# Patient Record
Sex: Female | Born: 2000 | Race: Black or African American | Hispanic: No | Smoking: Never smoker
Health system: Southern US, Community
[De-identification: ages and names within clinical notes are randomized; demographics above are authoritative.]

## PROBLEM LIST (undated history)

## (undated) DIAGNOSIS — N946 Dysmenorrhea, unspecified: Secondary | ICD-10-CM

## (undated) DIAGNOSIS — N939 Abnormal uterine and vaginal bleeding, unspecified: Secondary | ICD-10-CM

## (undated) DIAGNOSIS — K219 Gastro-esophageal reflux disease without esophagitis: Secondary | ICD-10-CM

## (undated) HISTORY — DX: Gastro-esophageal reflux disease without esophagitis: K21.9

## (undated) HISTORY — DX: Abnormal uterine and vaginal bleeding, unspecified: N93.9

## (undated) HISTORY — DX: Dysmenorrhea, unspecified: N94.6

---

## 2018-07-07 ENCOUNTER — Other Ambulatory Visit: Payer: Self-pay

## 2018-07-07 ENCOUNTER — Emergency Department (HOSPITAL_COMMUNITY)
Admission: EM | Admit: 2018-07-07 | Discharge: 2018-07-07 | Disposition: A | Discharge status: Home / Self Care | Payer: Self-pay | Attending: Emergency Medicine | Admitting: Emergency Medicine

## 2018-07-07 ENCOUNTER — Encounter (HOSPITAL_COMMUNITY): Payer: Self-pay

## 2018-07-07 DIAGNOSIS — L258 Unspecified contact dermatitis due to other agents: Secondary | ICD-10-CM | POA: Insufficient documentation

## 2018-07-07 MED ORDER — TRIAMCINOLONE ACETONIDE 0.025 % EX CREA: 1.0000 "application " | TOPICAL_CREAM | Freq: Two times a day (BID) | CUTANEOUS | 0 refills | Status: DC

## 2018-07-07 NOTE — ED Provider Notes (Signed)
  [facility] DEPT Provider Note   CSN: [id number] Arrival date & time: 07/07/18  1944     History   Chief Complaint Chief Complaint  Patient presents with  . Rash    HPI Paige Morales is a 18 y.o. female.  The history is provided by the patient.  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  Pt reports her had peel after she works.  Pt reports it happens at work.  Pt reports she wears gloves and job changed her gloves but no improvement History reviewed. No pertinent past medical history.  There are no active problems to display for this patient.      OB History   No obstetric history on file.      Home Medications    Prior to Admission medications   Not on File    Family History History reviewed. No pertinent family history.  Social History Social History   Tobacco Use  . Smoking status: Not on file  Substance Use Topics  . Alcohol use: Not on file  . Drug use: Not on file     Allergies   Patient has no known allergies.   Review of Systems Review of Systems  Skin: Positive for rash.  All other systems reviewed and are negative.    Physical Exam Updated Vital Signs BP 115/86 (BP Location: Left Arm)   Pulse 72   Temp 98 F (36.7 C) (Oral)   Resp 16   SpO2 100%   Physical Exam Vitals signs reviewed.  Constitutional:      Appearance: Normal appearance.  Musculoskeletal: Normal range of motion.  Skin:    General: Skin is warm.  Neurological:     General: No focal deficit present.     Mental Status: She is alert.  Psychiatric:        Mood and Affect: Mood normal.      ED Treatments / Results  Labs (all labs ordered are listed, but only abnormal results are displayed) Labs Reviewed - No data to display  EKG None  Radiology No results  found.  Procedures Procedures (including critical care time)  Medications Ordered in ED Medications - No data to display   Initial Impression / Assessment and Plan / ED Course  I have reviewed the triage vital signs and the nursing notes.  Pertinent labs & imaging results that were available during my care of the patient were reviewed by me and considered in my medical decision making (see chart for details).     MDM  Pt currently does not have symptoms.  normal exam. I suspect dermatitis.  I will try pt on triamcinalone    Final Clinical Impressions(s) / ED Diagnoses   Final diagnoses:  Contact dermatitis due to other agent, unspecified contact dermatitis type    ED Discharge Orders    None    An After Visit Summary was printed and given to the patient.    Sidney Ace 07/07/18 2043    Dorie Rank, MD 07/07/18 [phone]

## 2018-07-07 NOTE — ED Triage Notes (Signed)
Pt reports that her hands intermittently burn, itch, and peel. She reports that it often happens at work. No symptoms right now.

## 2019-02-07 ENCOUNTER — Encounter: Payer: Medicaid Other | Admitting: Obstetrics and Gynecology

## 2019-02-14 ENCOUNTER — Telehealth: Payer: Self-pay | Admitting: Obstetrics and Gynecology

## 2019-02-14 NOTE — Telephone Encounter (Signed)
Called patient regarding appointment and the following message was left:   We have you scheduled for an upcoming appointment at our office. At this time, patients are encouraged to come alone to their visits whenever possible, however, a support person, over age 20, may accompany you to your appointment if assistance is needed for safety or care concerns. Otherwise, support persons should remain outside until the visit is complete.   We ask if you have had any exposure to anyone suspected or confirmed of having COVID-19 or if you are experiencing any of the following, to call and reschedule your appointment: fever, cough, shortness of breath, muscle pain, diarrhea, rash, vomiting, abdominal pain, red eye, weakness, bruising, bleeding, joint pain, or a severe headache.   Please know we will ask you these questions or similar questions when you arrive for your appointment and again its how we are keeping everyone safe.    Also,to keep you safe, please use the provided hand sanitizer when you enter the office. We are asking everyone in the office to wear a mask to help prevent the spread of germs. If you have a mask of your own, please wear it to your appointment, if not, we are happy to provide one for you.  Thank you for understanding and your cooperation.    [facility] Staff

## 2019-02-15 ENCOUNTER — Encounter: Payer: Self-pay | Admitting: Obstetrics and Gynecology

## 2019-02-15 ENCOUNTER — Other Ambulatory Visit: Payer: Self-pay

## 2019-02-15 ENCOUNTER — Ambulatory Visit (INDEPENDENT_AMBULATORY_CARE_PROVIDER_SITE_OTHER): Payer: Medicaid Other | Admitting: Obstetrics and Gynecology

## 2019-02-15 VITALS — BP 111/64 | HR 75 | Ht 64.0 in | Wt 122.0 lb

## 2019-02-15 DIAGNOSIS — Z113 Encounter for screening for infections with a predominantly sexual mode of transmission: Secondary | ICD-10-CM

## 2019-02-15 DIAGNOSIS — N898 Other specified noninflammatory disorders of vagina: Secondary | ICD-10-CM

## 2019-02-15 DIAGNOSIS — Z30011 Encounter for initial prescription of contraceptive pills: Secondary | ICD-10-CM | POA: Diagnosis not present

## 2019-02-15 DIAGNOSIS — Z3202 Encounter for pregnancy test, result negative: Secondary | ICD-10-CM | POA: Diagnosis not present

## 2019-02-15 LAB — POCT URINE PREGNANCY: Preg Test, Ur: NEGATIVE

## 2019-02-15 MED ORDER — FLUCONAZOLE 150 MG PO TABS: 150.0000 mg | ORAL_TABLET | ORAL | 1 refills | Status: DC

## 2019-02-15 MED ORDER — NORETHIN ACE-ETH ESTRAD-FE 1-20 MG-MCG(24) PO TABS: 1.0000 | ORAL_TABLET | Freq: Every day | ORAL | 11 refills | Status: DC

## 2019-02-15 MED ORDER — MICONAZOLE NITRATE 2 % VA CREA: 1.0000 | TOPICAL_CREAM | Freq: Every day | VAGINAL | Status: DC

## 2019-02-15 NOTE — Addendum Note (Signed)
Addended by: Diona Fanti A on: 02/15/2019 11:55 AM   Modules accepted: Orders

## 2019-02-15 NOTE — Progress Notes (Signed)
Patient ID: Paige Morales, female   DOB: Dec 29, 2000, 18 y.o.   MRN: [id number]    [facility] Visit  @DATE @            Patient name: Paige Morales MRN [id number]  Date of birth: 2000/12/04  CC & HPI:  [name] is a 18 y.o. female presenting today to discuss contraception management. She has never used any birth control in the past. She is sexually active and always uses condoms during intercourse. Recently, she noticed a new odorless discharge after intercourse which she describes as "creamy."  The pt is not interested in Depo because she is worried about weight gain. Other than that, she is keeping an open mind.  The patient's periods are slightly irregular. She notes that around the time of her periods, she experiences some swelling in her breasts. After her period, she has vaginal itching. The patient denies fever, chills or any other symptoms or complaints at this time.   ROS:  ROS  + abnormal discharge + vaginal itchiness - fever - chills All systems are negative except as noted in the HPI and PMH.   Pertinent History Reviewed:   Reviewed: Medical        No past medical history on file.                            Surgical Hx:    Medications: Reviewed & Updated - see associated section                       Current Outpatient Medications:  .  ibuprofen (ADVIL) 800 MG tablet, Take 800 mg by mouth every 8 (eight) hours as needed., Disp: , Rfl:  .  triamcinolone (KENALOG) 0.025 % cream, Apply 1 application topically 2 (two) times daily. (Patient not taking: Reported on 02/15/2019), Disp: 30 g, Rfl: 0   Social History: Reviewed -  reports that she has never smoked. She has never used smokeless tobacco.  Objective Findings:  Vitals: Blood pressure 111/64, pulse 75, height 5\' 4"  (1.626 m), weight 122 lb (55.3 kg), last menstrual period 02/05/2019.  PHYSICAL EXAMINATION General appearance - alert, well appearing, and in no distress, oriented to person, place, and time and  normal appearing weight Mental status - alert, oriented to person, place, and time, normal mood, behavior, speech, dress, motor activity, and thought processes, affect appropriate to mood  PELVIC Vagina: clumpy whitish yellow discharge at vaginal entrance Wet prep and koh + yeast Discussion: 1. Discussed with pt risks and benefits of various methods of contraception At end of discussion, pt had opportunity to ask questions and has no further questions at this time.  Specific discussion of contraceptive as noted above. Greater than 50% was spent in counseling and coordination of care with the patient.  Total time greater than: 20 minutes  Assessment & Plan:   A:  1. Contraception Management -- pt would like to begin taking BCPs 2. Monilia vaginitis 3. GC/CHL swab done today  P:  1. Rx Loloestrin 1/20 2. Monistat and diflucan  By signing my name below, I, De Burrs, attest that this documentation has been prepared under the direction and in the presence of Jonnie Kind, MD Electronically Signed: De Burrs, Medical Scribe. 02/15/19. 10:48 AM.  I personally performed the services described in this documentation, which was SCRIBED in my presence. The recorded information has been reviewed and considered accurate. It has  been edited as necessary during review. Jonnie Kind, MD

## 2019-02-20 LAB — GC/CHLAMYDIA PROBE AMP
Chlamydia trachomatis, NAA: NEGATIVE
Neisseria Gonorrhoeae by PCR: NEGATIVE

## 2019-03-07 ENCOUNTER — Telehealth: Payer: Self-pay | Admitting: *Deleted

## 2019-03-07 NOTE — Telephone Encounter (Signed)
Patient states Monistat nor Diflucan was not sent to pharmacy.  Informed patient Monistat is OTC and Diflucan was sent.  Patient states pharmacy listed is not correct.  Wanted it sent to [facility]. Pharmacy notified.

## 2019-03-07 NOTE — Telephone Encounter (Signed)
States cream was going to be called to pharmacy.

## 2019-03-12 ENCOUNTER — Telehealth: Payer: Self-pay | Admitting: *Deleted

## 2019-03-12 NOTE — Telephone Encounter (Signed)
Pt says that the doctor was supposed to send in birth control pills but they didn't.

## 2019-03-12 NOTE — Telephone Encounter (Signed)
Left message letting pt know birth control was sent to pharmacy on 02/15/19 with refills. [place]

## 2020-02-05 ENCOUNTER — Ambulatory Visit (INDEPENDENT_AMBULATORY_CARE_PROVIDER_SITE_OTHER): Payer: Medicaid Other | Admitting: Adult Health

## 2020-02-05 ENCOUNTER — Encounter: Payer: Self-pay | Admitting: Adult Health

## 2020-02-05 ENCOUNTER — Other Ambulatory Visit (HOSPITAL_COMMUNITY)
Admission: RE | Admit: 2020-02-05 | Discharge: 2020-02-05 | Disposition: A | Discharge status: Home / Self Care | Payer: Medicaid Other | Source: Ambulatory Visit | Attending: Adult Health | Admitting: Adult Health

## 2020-02-05 VITALS — BP 103/68 | HR 73 | Ht 64.0 in | Wt 140.0 lb

## 2020-02-05 DIAGNOSIS — Z01419 Encounter for gynecological examination (general) (routine) without abnormal findings: Secondary | ICD-10-CM | POA: Insufficient documentation

## 2020-02-05 DIAGNOSIS — Z124 Encounter for screening for malignant neoplasm of cervix: Secondary | ICD-10-CM | POA: Diagnosis not present

## 2020-02-05 DIAGNOSIS — Z113 Encounter for screening for infections with a predominantly sexual mode of transmission: Secondary | ICD-10-CM | POA: Diagnosis not present

## 2020-02-05 DIAGNOSIS — N946 Dysmenorrhea, unspecified: Secondary | ICD-10-CM | POA: Diagnosis not present

## 2020-02-05 DIAGNOSIS — L739 Follicular disorder, unspecified: Secondary | ICD-10-CM | POA: Insufficient documentation

## 2020-02-05 MED ORDER — NAPROXEN SODIUM 550 MG PO TABS: ORAL_TABLET | ORAL | 3 refills | Status: DC

## 2020-02-05 NOTE — Progress Notes (Addendum)
Patient ID: Paige Morales, female   DOB: 05/25/2000, 19 y.o.   MRN: [id number] History of Present Illness: Paige Morales is a 19 year old [place], in for a well woman gyn exam and first pap.    Current Medications, Allergies, Past Medical History, Past Surgical History, Family History and Social History were reviewed in [employer] record.     Review of Systems: Patient denies any headaches, hearing loss, fatigue, blurred vision, shortness of breath, chest pain, problems with bowel movements, urination, or intercourse. No joint pain or mood swings. Has pain with periods and ibuprofen does not help Has bumps from shaving, and occasional itch Has hard time reaching orgasm   Physical Exam:BP 103/68 (BP Location: Left Arm, Patient Position: Sitting, Cuff Size: Normal)   Pulse 73   Ht 5\' 4"  (1.626 m)   Wt 140 lb (63.5 kg)   LMP 01/25/2020 (Exact Date)   BMI 24.03 kg/m  General:  Well developed, well nourished, no acute distress Skin:  Warm and dry Neck:  Midline trachea, normal thyroid, good ROM, no lymphadenopathy Lungs; Clear to auscultation bilaterally Breast:  No dominant palpable mass, retraction, or nipple discharge Cardiovascular: Regular rate and rhythm Abdomen:  Soft, non tender, no hepatosplenomegaly Pelvic:  External genitalia is normal in appearance, has mild folliculitis or razor bumps.  The vagina is normal in appearance. Urethra has no lesions or masses. The cervix is smooth, pap with GC/CHL and reflex HPV performed.  Uterus is felt to be normal size, shape, and contour.  No adnexal masses or tenderness noted.Bladder is non tender, no masses felt. Extremities/musculoskeletal:  No swelling or varicosities noted, no clubbing or cyanosis Psych:  No mood changes, alert and cooperative,seems happy AA is 1 Fall risk is low PHQ 9 score is 0  [place] - 02/05/20 1335      Pregnancy Intention Screening   Does the patient want to become  pregnant in the next year? Unsure    Does the patient's partner want to become pregnant in the next year? No    Would the patient like to discuss contraceptive options today? --   unsure     Contraception Wrap Up   Current Method No Method - Other Reason    End Method No Method - Other Reason    Contraception Counseling Provided No          Examination chaperoned by Diona Fanti CMA   Impression and Plan: 1. Routine cervical smear Pap sent  2. Encounter for gynecological examination with Papanicolaou smear of cervix Pap sent Physical in 1 year Pap in 3 if normal Check CBC,CMP,TSH and lipids Take folic acid 094 mcg 1 daily or PNV Try more clitoral stimulation   3. Dysmenorrhea Will rx anaprox ds Meds ordered this encounter  Medications  . naproxen sodium (ANAPROX DS) 550 MG tablet    Sig: Take 1 every 12 hours as needed for period pain    Dispense:  30 tablet    Refill:  3    Order Specific Question:   Supervising Provider    Answer:   Elonda Husky, LUTHER H [[id number]]    4. Folliculitis Change razors often Do not shave so close   5. Screening examination for STD (sexually transmitted disease) Check HIV and RPR and hepatitis C antibody

## 2020-02-06 LAB — COMPREHENSIVE METABOLIC PANEL
ALT: 8 IU/L (ref 0–32)
AST: 20 IU/L (ref 0–40)
Albumin/Globulin Ratio: 1.3 (ref 1.2–2.2)
Albumin: 4.8 g/dL (ref 3.9–5.0)
Alkaline Phosphatase: 74 IU/L (ref 44–121)
BUN/Creatinine Ratio: 7 — ABNORMAL LOW (ref 9–23)
BUN: 5 mg/dL — ABNORMAL LOW (ref 6–20)
Bilirubin Total: 0.5 mg/dL (ref 0.0–1.2)
CO2: 23 mmol/L (ref 20–29)
Calcium: 9.6 mg/dL (ref 8.7–10.2)
Chloride: 100 mmol/L (ref 96–106)
Creatinine, Ser: 0.74 mg/dL (ref 0.57–1.00)
GFR calc Af Amer: 134 mL/min/{1.73_m2} (ref >59)
GFR calc non Af Amer: 116 mL/min/{1.73_m2} (ref >59)
Globulin, Total: 3.6 g/dL (ref 1.5–4.5)
Glucose: 84 mg/dL (ref 65–99)
Potassium: 4.3 mmol/L (ref 3.5–5.2)
Sodium: 138 mmol/L (ref 134–144)
Total Protein: 8.4 g/dL (ref 6.0–8.5)

## 2020-02-06 LAB — CBC
Hematocrit: 37.5 % (ref 34.0–46.6)
Hemoglobin: 11.5 g/dL (ref 11.1–15.9)
MCH: 24.1 pg — ABNORMAL LOW (ref 26.6–33.0)
MCHC: 30.7 g/dL — ABNORMAL LOW (ref 31.5–35.7)
MCV: 79 fL (ref 79–97)
Platelets: 243 10*3/uL (ref 150–450)
RBC: 4.78 x10E6/uL (ref 3.77–5.28)
RDW: 13.3 % (ref 11.7–15.4)
WBC: 4.3 10*3/uL (ref 3.4–10.8)

## 2020-02-06 LAB — LIPID PANEL
Chol/HDL Ratio: 2.6 ratio (ref 0.0–4.4)
Cholesterol, Total: 150 mg/dL (ref 100–199)
HDL: 57 mg/dL (ref >39)
LDL Chol Calc (NIH): 76 mg/dL (ref 0–99)
Triglycerides: 90 mg/dL (ref 0–149)
VLDL Cholesterol Cal: 17 mg/dL (ref 5–40)

## 2020-02-06 LAB — HIV ANTIBODY (ROUTINE TESTING W REFLEX): HIV Screen 4th Generation wRfx: NONREACTIVE

## 2020-02-06 LAB — HEPATITIS C ANTIBODY: Hep C Virus Ab: 0.2 s/co ratio (ref 0.0–0.9)

## 2020-02-06 LAB — RPR: RPR Ser Ql: NONREACTIVE

## 2020-02-06 LAB — TSH: TSH: 2.5 u[IU]/mL (ref 0.450–4.500)

## 2020-02-07 LAB — CYTOLOGY - PAP
Chlamydia: NEGATIVE
Comment: NEGATIVE
Comment: NORMAL
Diagnosis: NEGATIVE
Neisseria Gonorrhea: NEGATIVE

## 2020-02-20 ENCOUNTER — Telehealth: Payer: Self-pay | Admitting: Adult Health

## 2020-02-20 NOTE — Telephone Encounter (Signed)
Patient called stating that she came into the office to see Anderson Malta on 02/05/2020 and that she was suppose have a medication called into the pharmacy but the pharmacy is stating that they have not received it and that she would need to call us. Please contact please

## 2020-02-20 NOTE — Telephone Encounter (Signed)
Pt was prescribed Anaprox and her insurance is requiring a PA. Pt was advised can get OTC Aleve and take it. Pt voiced understanding. [place]

## 2020-03-19 ENCOUNTER — Telehealth: Payer: Self-pay | Admitting: Adult Health

## 2020-03-19 MED ORDER — FLUCONAZOLE 150 MG PO TABS: ORAL_TABLET | ORAL | 0 refills | Status: DC

## 2020-03-19 NOTE — Telephone Encounter (Signed)
Patient called stating that she was prescribed something for her yeast infection by Dr. Glo Herring when she last saw him and she never picked it up, when she went to the pharmacy they told her that the prescription was expired. Patient would like to know if we could call her in the prescription again to her pharmacy. Pt would also like to inform the provider that her still has Medicaid family Planning and she is not sure if they will cover this medication. Please contact pt

## 2020-03-19 NOTE — Telephone Encounter (Signed)
Will rx diflucan  

## 2020-03-19 NOTE — Addendum Note (Signed)
Addended by: Derrek Monaco A on: 03/19/2020 05:09 PM   Modules accepted: Orders

## 2020-08-06 ENCOUNTER — Other Ambulatory Visit (HOSPITAL_COMMUNITY)
Admission: RE | Admit: 2020-08-06 | Discharge: 2020-08-06 | Disposition: A | Discharge status: Home / Self Care | Payer: Medicaid Other | Source: Ambulatory Visit | Attending: Obstetrics & Gynecology | Admitting: Obstetrics & Gynecology

## 2020-08-06 ENCOUNTER — Other Ambulatory Visit: Payer: Self-pay

## 2020-08-06 ENCOUNTER — Encounter: Payer: Self-pay | Admitting: Women's Health

## 2020-08-06 ENCOUNTER — Ambulatory Visit (INDEPENDENT_AMBULATORY_CARE_PROVIDER_SITE_OTHER): Payer: Self-pay | Admitting: Women's Health

## 2020-08-06 VITALS — BP 118/74 | HR 92 | Ht 64.0 in | Wt 142.0 lb

## 2020-08-06 DIAGNOSIS — N9089 Other specified noninflammatory disorders of vulva and perineum: Secondary | ICD-10-CM | POA: Insufficient documentation

## 2020-08-06 DIAGNOSIS — Z113 Encounter for screening for infections with a predominantly sexual mode of transmission: Secondary | ICD-10-CM | POA: Insufficient documentation

## 2020-08-06 DIAGNOSIS — B373 Candidiasis of vulva and vagina: Secondary | ICD-10-CM | POA: Diagnosis not present

## 2020-08-06 MED ORDER — FLUCONAZOLE 150 MG PO TABS: 150.0000 mg | ORAL_TABLET | Freq: Once | ORAL | 0 refills | Status: AC

## 2020-08-06 NOTE — Progress Notes (Signed)
   GYN VISIT Patient name: Paige Morales MRN [id number]  Date of birth: 2001-03-15 Chief Complaint:   Vaginal Discharge  History of Present Illness:   Paige Morales is a 20 y.o. G0P0000 African American female being seen today for report of vulvar irritation, itching before and after period.  Some occ odor. Looks like there are cuts at times. Has tried vagisil and didn't help.     Depression screen [facility] 2/9 02/05/2020  Decreased Interest 0  Down, Depressed, Hopeless 0  PHQ - 2 Score 0  Altered sleeping 0  Tired, decreased energy 0  Change in appetite 0  Feeling bad or failure about yourself  0  Trouble concentrating 0  Moving slowly or fidgety/restless 0  Suicidal thoughts 0  PHQ-9 Score 0  Difficult doing work/chores Not difficult at all    Patient's last menstrual period was 07/30/2020 (exact date). The current method of family planning is coitus interruptus.  Last pap 02/05/20. Results were: NILM w/ HRHPV not done Review of Systems:   Pertinent items are noted in HPI Denies fever/chills, dizziness, headaches, visual disturbances, fatigue, shortness of breath, chest pain, abdominal pain, vomiting, abnormal vaginal discharge/itching/odor/irritation, problems with periods, bowel movements, urination, or intercourse unless otherwise stated above.  Pertinent History Reviewed:  Reviewed past medical,surgical, social, obstetrical and family history.  Reviewed problem list, medications and allergies. Physical Assessment:   Vitals:   08/06/20 0846  BP: 118/74  Pulse: 92  Weight: 142 lb (64.4 kg)  Height: 5\' 4"  (1.626 m)  Body mass index is 24.37 kg/m.       Physical Examination:   General appearance: alert, well appearing, and in no distress  Mental status: alert, oriented to person, place, and time  Skin: warm & dry   Cardiovascular: normal heart rate noted  Respiratory: normal respiratory effort, no distress  Abdomen: soft, non-tender   Pelvic: VULVA: whitish appearance to areas  of bilateral labia majora c/w canddida, no masses, tenderness or lesions, VAGINA: normal appearing vagina with normal color and some thick clumpy discharge, no lesions, CERVIX: normal appearing cervix without discharge or lesions  Extremities: no edema   Vulva painted w/ gentian violet  Chaperone: Celene Squibb    No results found for this or any previous visit (from the past 24 hour(s)).  Assessment & Plan:  1) Vulvar itching/irritation> appearance c/w candida, painted w/ gentian violet, rx diflucan, send CV swab  2) STD screen  Meds:  Meds ordered this encounter  Medications  . fluconazole (DIFLUCAN) 150 MG tablet    Sig: Take 1 tablet (150 mg total) by mouth once for 1 dose. Take 1 pill now, may take 2nd pill in 3 days if needed    Dispense:  2 tablet    Refill:  0    Order Specific Question:   Supervising Provider    Answer:   Elonda Husky, LUTHER H [[id number]]    No orders of the defined types were placed in this encounter.   No follow-ups on file.  [place], [facility] 08/06/2020 9:23 AM

## 2020-08-07 LAB — CERVICOVAGINAL ANCILLARY ONLY
Bacterial Vaginitis (gardnerella): NEGATIVE
Candida Glabrata: NEGATIVE
Candida Vaginitis: POSITIVE — AB
Chlamydia: NEGATIVE
Comment: NEGATIVE
Comment: NEGATIVE
Comment: NEGATIVE
Comment: NEGATIVE
Comment: NEGATIVE
Comment: NORMAL
Neisseria Gonorrhea: NEGATIVE
Trichomonas: NEGATIVE

## 2021-02-10 ENCOUNTER — Other Ambulatory Visit: Payer: Self-pay | Admitting: Adult Health

## 2021-02-11 ENCOUNTER — Ambulatory Visit: Payer: Medicaid Other | Admitting: Podiatry

## 2021-06-03 DIAGNOSIS — M26622 Arthralgia of left temporomandibular joint: Secondary | ICD-10-CM | POA: Insufficient documentation

## 2021-06-03 DIAGNOSIS — H9202 Otalgia, left ear: Secondary | ICD-10-CM | POA: Insufficient documentation

## 2021-06-24 ENCOUNTER — Telehealth: Payer: Self-pay | Admitting: Adult Health

## 2021-06-24 MED ORDER — FLUCONAZOLE 150 MG PO TABS: ORAL_TABLET | ORAL | 1 refills | Status: DC

## 2021-06-24 NOTE — Telephone Encounter (Signed)
Refilled diflucan

## 2021-06-24 NOTE — Telephone Encounter (Signed)
Patient called stating that she would like for [facility] to call ina refill of her Fluconazole for her yeast infection. Patient states she uses a different pharmacy now [facility] [place]. Address [address] [address]. Please contact pt when medication has been called in.

## 2021-06-24 NOTE — Addendum Note (Signed)
Addended by: Derrek Monaco A on: 06/24/2021 12:47 PM   Modules accepted: Orders

## 2021-06-24 NOTE — Telephone Encounter (Signed)
Pt is requesting Diflucan for a yeast infection. Thanks!! [place]

## 2021-09-04 ENCOUNTER — Emergency Department (HOSPITAL_COMMUNITY)
Admission: EM | Admit: 2021-09-04 | Discharge: 2021-09-04 | Disposition: A | Discharge status: Home / Self Care | Payer: Medicaid Other | Attending: Emergency Medicine | Admitting: Emergency Medicine

## 2021-09-04 ENCOUNTER — Emergency Department (HOSPITAL_COMMUNITY): Payer: Medicaid Other

## 2021-09-04 ENCOUNTER — Other Ambulatory Visit: Payer: Self-pay

## 2021-09-04 ENCOUNTER — Encounter (HOSPITAL_COMMUNITY): Payer: Self-pay

## 2021-09-04 DIAGNOSIS — N9489 Other specified conditions associated with female genital organs and menstrual cycle: Secondary | ICD-10-CM | POA: Insufficient documentation

## 2021-09-04 DIAGNOSIS — R1012 Left upper quadrant pain: Secondary | ICD-10-CM | POA: Insufficient documentation

## 2021-09-04 DIAGNOSIS — R1084 Generalized abdominal pain: Secondary | ICD-10-CM | POA: Insufficient documentation

## 2021-09-04 DIAGNOSIS — R197 Diarrhea, unspecified: Secondary | ICD-10-CM | POA: Insufficient documentation

## 2021-09-04 LAB — CBC
HCT: 35.3 % — ABNORMAL LOW (ref 36.0–46.0)
Hemoglobin: 11 g/dL — ABNORMAL LOW (ref 12.0–15.0)
MCH: 24.7 pg — ABNORMAL LOW (ref 26.0–34.0)
MCHC: 31.2 g/dL (ref 30.0–36.0)
MCV: 79.3 fL — ABNORMAL LOW (ref 80.0–100.0)
Platelets: 223 10*3/uL (ref 150–400)
RBC: 4.45 MIL/uL (ref 3.87–5.11)
RDW: 12.7 % (ref 11.5–15.5)
WBC: 8 10*3/uL (ref 4.0–10.5)
nRBC: 0 % (ref 0.0–0.2)

## 2021-09-04 LAB — COMPREHENSIVE METABOLIC PANEL
ALT: 34 U/L (ref 0–44)
AST: 77 U/L — ABNORMAL HIGH (ref 15–41)
Albumin: 4.1 g/dL (ref 3.5–5.0)
Alkaline Phosphatase: 64 U/L (ref 38–126)
Anion gap: 10 (ref 5–15)
BUN: 10 mg/dL (ref 6–20)
CO2: 19 mmol/L — ABNORMAL LOW (ref 22–32)
Calcium: 9 mg/dL (ref 8.9–10.3)
Chloride: 106 mmol/L (ref 98–111)
Creatinine, Ser: 0.7 mg/dL (ref 0.44–1.00)
GFR, Estimated: >60 mL/min (ref >60)
Glucose, Bld: 109 mg/dL — ABNORMAL HIGH (ref 70–99)
Potassium: 3.4 mmol/L — ABNORMAL LOW (ref 3.5–5.1)
Sodium: 135 mmol/L (ref 135–145)
Total Bilirubin: 1 mg/dL (ref 0.3–1.2)
Total Protein: 7.2 g/dL (ref 6.5–8.1)

## 2021-09-04 LAB — I-STAT BETA HCG BLOOD, ED (MC, WL, AP ONLY): I-stat hCG, quantitative: <5 m[IU]/mL (ref <5)

## 2021-09-04 LAB — LIPASE, BLOOD: Lipase: 45 U/L (ref 11–51)

## 2021-09-04 MED ORDER — LEVOFLOXACIN 500 MG PO TABS: 500.0000 mg | ORAL_TABLET | Freq: Every day | ORAL | 0 refills | Status: AC

## 2021-09-04 NOTE — ED Provider Notes (Signed)
?[facility] ?Provider Note ? ? ?CSN: [id number] ?Arrival date & time: 09/04/21  0013 ? ?  ? ?History ? ?Chief Complaint  ?Patient presents with  ? Abdominal Pain  ? ? ?Paige Morales is a 21 y.o. female. ? ?Pt reports she began having discomfort and diarrhea on 4/1 after eating at a [employer].   ? ?The history is provided by the patient. No language interpreter was used.  ?Abdominal Pain ?Pain location:  LUQ and RUQ ?Pain quality: aching   ?Pain radiates to:  Does not radiate ?Pain severity:  Moderate ?Onset quality:  Gradual ?Duration:  2 weeks ?Timing:  Constant ?Progression:  Worsening ?Chronicity:  New ?Context: not sick contacts   ?Relieved by:  Nothing ?Worsened by:  Nothing ?Ineffective treatments:  None tried ?Associated symptoms: no fever   ?Risk factors: no alcohol abuse   ? ?  ? ?Home Medications ?Prior to Admission medications   ?Medication Sig Start Date End Date Taking? Authorizing Provider  ?Biotin w/ Vitamins C & E (HAIR/SKIN/NAILS PO) Take 2 tablets by mouth daily as needed (When Pt remembers-Growth).   Yes [provider]  ?Brimonidine Tartrate (LUMIFY OP) Place 1 drop into both eyes daily.   Yes [provider]  ?famotidine (PEPCID) 40 MG tablet Take 40 mg by mouth at bedtime as needed. 08/26/21  Yes [provider]  ?MELATONIN PO Take 2 tablets by mouth at bedtime as needed (Sleep).   Yes [provider]  ?Multiple Vitamin (MULTIVITAMIN PO) Take 2 tablets by mouth daily as needed (When Pt remembers).   Yes [provider]  ?naproxen sodium (ANAPROX) 550 MG tablet TAKE 1 TABLET BY MOUTH EVERY 12 HOURS AS NEEDED FOR PERIOD PAIN ?Patient taking differently: Take 550 mg by mouth daily as needed (period pain). 02/10/21  Yes Estill Dooms, NP  ?omeprazole (PRILOSEC) 20 MG capsule Take 20 mg by mouth daily. 08/25/21  Yes [provider]  ?OVER THE COUNTER MEDICATION Take 1 tablet by mouth daily as needed  (When Pt remembers). Apple Cider Vinegar Gummies   Yes [provider]  ?fluconazole (DIFLUCAN) 150 MG tablet Take 1 now and 1 in 3 days ?Patient not taking: Reported on 09/04/2021 06/24/21   Estill Dooms, NP  ?   ? ?Allergies    ?Patient has no known allergies.   ? ?Review of Systems   ?Review of Systems  ?Constitutional:  Negative for fever.  ?Gastrointestinal:  Positive for abdominal pain.  ?All other systems reviewed and are negative. ? ?Physical Exam ?Updated Vital Signs ?BP (!) 95/55   Pulse 64   Temp (!) 97.5 ?F (36.4 ?C) (Oral)   Resp 18   LMP 09/04/2021   SpO2 100%  ?Physical Exam ?Vitals and nursing note reviewed.  ?Constitutional:   ?   Appearance: She is well-developed.  ?HENT:  ?   Head: Normocephalic.  ?Cardiovascular:  ?   Rate and Rhythm: Normal rate and regular rhythm.  ?Pulmonary:  ?   Effort: Pulmonary effort is normal.  ?Abdominal:  ?   General: Bowel sounds are normal. There is no distension.  ?   Tenderness: There is generalized abdominal tenderness.  ?Musculoskeletal:     ?   General: Normal range of motion.  ?   Cervical back: Normal range of motion.  ?Neurological:  ?   Mental Status: She is alert and oriented to person, place, and time.  ? ? ?ED Results / Procedures / Treatments   ?  Labs ?(all labs ordered are listed, but only abnormal results are displayed) ?Labs Reviewed  ?COMPREHENSIVE METABOLIC PANEL - Abnormal; Notable for the following components:  ?    Result Value  ? Potassium 3.4 (*)   ? CO2 19 (*)   ? Glucose, Bld 109 (*)   ? AST 77 (*)   ? All other components within normal limits  ?CBC - Abnormal; Notable for the following components:  ? Hemoglobin 11.0 (*)   ? HCT 35.3 (*)   ? MCV 79.3 (*)   ? MCH 24.7 (*)   ? All other components within normal limits  ?GASTROINTESTINAL PANEL BY PCR, STOOL (REPLACES STOOL CULTURE)  ?LIPASE, BLOOD  ?URINALYSIS, ROUTINE W REFLEX MICROSCOPIC  ?I-STAT BETA HCG BLOOD, ED ([facility], [facility], [facility] ONLY)  ? ? ?EKG ?None ? ?Radiology ?US Abdomen  Limited RUQ (LIVER/GB) ? ?Result Date: 09/04/2021 ?CLINICAL DATA:  Right upper quadrant pain. EXAM: ULTRASOUND ABDOMEN LIMITED RIGHT UPPER QUADRANT COMPARISON:  None. FINDINGS: Gallbladder: No gallstones or wall thickening visualized (2.7 mm. No sonographic Murphy sign noted by sonographer. Common bile duct: Diameter: 3.2 mm Liver: No focal lesion identified. Within normal limits in parenchymal echogenicity. Portal vein is patent on color Doppler imaging with normal direction of blood flow towards the liver. Other: None. IMPRESSION: Normal right upper quadrant ultrasound. Electronically Signed   By: Virgina Norfolk M.D.   On: 09/04/2021 00:54   ? ?Procedures ?Procedures  ? ? ?Medications Ordered in ED ?Medications - No data to display ? ?ED Course/ Medical Decision Making/ A&P ?  ?                        ?Medical Decision Making ?Pt complains of diarrhea and abdominal discomfort for 2 weeks.  ? ?Amount and/or Complexity of Data Reviewed ?Independent Historian: friend ?External Data Reviewed: notes. ?   Details: Urgent care notes reviewed ?Labs: ordered. Decision-making details documented in ED Course. ?   Details: WBC count is normal  Hemoglobin is 11 ?Radiology: ordered and independent interpretation performed. Decision-making details documented in ED Course. ?   Details: Gallbladder ultreasound is normal ? ?Risk ?Parenteral controlled substances. ?Risk Details: Stool culture pending.  Pt given rx for levaquin  ? ? ?MDM:  Ultrasound no gallbladder disease.  I ordered stool culture.   ? ? ? ? ? ? ? ?Final Clinical Impression(s) / ED Diagnoses ?Final diagnoses:  ?Diarrhea, unspecified type  ?Left upper quadrant abdominal pain  ? ? ?Rx / DC Orders ?ED Discharge Orders   ? ?      Ordered  ?  levofloxacin (LEVAQUIN) 500 MG tablet  Daily       ? 09/04/21 1557  ? ?  ?  ? ?  ?An After Visit Summary was printed and given to the patient.  ? ?  ?Fransico Meadow, [place] ?09/04/21 1609 ? ?  ?Lucrezia Starch, MD ?09/04/21  1617 ? ?

## 2021-09-04 NOTE — ED Provider Triage Note (Signed)
Emergency Medicine Provider Triage Evaluation Note ? ?Paige Morales , a 21 y.o. female  was evaluated in triage.  Pt complains of epigastric and RUQ pain.  Reports some nausea/vomiting with this.  Denies fever.  Currently on menstrual cycle.. ? ?Review of Systems  ?Positive: Abdominal pain, N/V ?Negative: fever ? ?Physical Exam  ?BP 116/73 (BP Location: Right Arm)   Pulse 61   Temp 98.4 ?F (36.9 ?C) (Oral)   Resp 18   LMP 09/04/2021   SpO2 100%  ? ?Gen:   Awake, no distress   ?Resp:  Normal effort  ?MSK:   Moves extremities without difficulty  ?Other:  Tender epigastrium and right upper quadrant ? ?Medical Decision Making  ?Medically screening exam initiated at 12:23 AM.  Appropriate orders placed.  Paige Morales was informed that the remainder of the evaluation will be completed by another provider, this initial triage assessment does not replace that evaluation, and the importance of remaining in the ED until their evaluation is complete. ? ?RUQ pain, N/V.  Will obtain labs, [place]. ?  ?Larene Pickett, PA-C ?09/04/21 6010 ? ?

## 2021-09-04 NOTE — ED Triage Notes (Signed)
PER EMS: pt reports central abd pain that radiates to her back associated with nausea and vomiting. Currently on menstrual cycle. ? ?EMS adm 100 mcg Fentanyl and '4mg'$  zofran en route ?20g LAC ? ?BP- 109/60, HR - 84, 99% RA, 16-RR, CBG-150 ?

## 2021-09-04 NOTE — Discharge Instructions (Signed)
TRy taking immodium to help with diarrhea  ?

## 2021-12-17 ENCOUNTER — Other Ambulatory Visit (HOSPITAL_COMMUNITY): Payer: Self-pay

## 2021-12-17 MED FILL — Naproxen Sodium Tab 550 MG: ORAL | 15 days supply | Qty: 30 | Fill #0 | Status: AC

## 2021-12-25 ENCOUNTER — Other Ambulatory Visit (HOSPITAL_COMMUNITY): Payer: Self-pay

## 2021-12-25 MED ORDER — DOXYCYCLINE HYCLATE 100 MG PO TABS
100.0000 mg | ORAL_TABLET | Freq: Every day | ORAL | 0 refills | Status: DC
  Filled 2021-12-25: qty 30, 30d supply, fill #0

## 2021-12-28 ENCOUNTER — Other Ambulatory Visit (HOSPITAL_COMMUNITY): Payer: Self-pay

## 2022-01-06 ENCOUNTER — Encounter: Payer: Self-pay | Admitting: Obstetrics and Gynecology

## 2022-01-06 ENCOUNTER — Ambulatory Visit (INDEPENDENT_AMBULATORY_CARE_PROVIDER_SITE_OTHER): Payer: No Typology Code available for payment source | Admitting: Obstetrics and Gynecology

## 2022-01-06 ENCOUNTER — Other Ambulatory Visit (HOSPITAL_COMMUNITY)
Admission: RE | Admit: 2022-01-06 | Discharge: 2022-01-06 | Disposition: A | Discharge status: Home / Self Care | Payer: Medicaid Other | Source: Ambulatory Visit | Attending: Obstetrics and Gynecology | Admitting: Obstetrics and Gynecology

## 2022-01-06 VITALS — BP 100/66 | HR 79 | Ht 64.0 in

## 2022-01-06 DIAGNOSIS — N946 Dysmenorrhea, unspecified: Secondary | ICD-10-CM

## 2022-01-06 DIAGNOSIS — Z113 Encounter for screening for infections with a predominantly sexual mode of transmission: Secondary | ICD-10-CM | POA: Diagnosis present

## 2022-01-06 DIAGNOSIS — N920 Excessive and frequent menstruation with regular cycle: Secondary | ICD-10-CM | POA: Diagnosis not present

## 2022-01-06 DIAGNOSIS — L739 Follicular disorder, unspecified: Secondary | ICD-10-CM | POA: Diagnosis not present

## 2022-01-06 LAB — PREGNANCY, URINE: Preg Test, Ur: NEGATIVE

## 2022-01-06 NOTE — Progress Notes (Signed)
GYNECOLOGY  VISIT   HPI: 21 y.o.   Single  african  female   [id number] with Patient's last menstrual period was 12/28/2021 (exact date).   here for heavy/painful menses, symptoms for the last 3 months.   Heavy bleeding is more recent, but the cramps are occurring for a while.  Menses last 3 days.  No bleeding in between cycles. Pad change is every three hours, but is using heavy pads.  Stains through overnight pads when sleeping.  Has pain more left sided.  Does have pain outside of her menses.   Has used Naproxen sodium, and this is not working anymore to control her pain.   Normal pelvic ultrasound 10/24/20 through [facility].   She has a lump that occurs on her labia after her menses every month.  She has white discharge from this.  She also has a lump appear near her rectum after her period as well.  These appear spontaneously, and then disappear.  Has not had testing.  Waxes her vulva.  Stopped shaving.   Female partner, steady relationship for almost 3 years.  Not preventing pregnancy.  Would accept pregnancy, but not trying for fertility right now.   She is looking for information and education about her health concerns.   Works for [employer]. From [place].   UPT today:  negative.  GYNECOLOGIC HISTORY: Patient's last menstrual period was 12/28/2021 (exact date). Contraception:  none Menopausal hormone therapy:  n/a Last mammogram:  n/a Last pap smear: 02-05-20 Neg        OB History     Gravida  0   Para  0   Term  0   Preterm  0   AB  0   Living  0      SAB  0   IAB  0   Ectopic  0   Multiple  0   Live Births  0              Patient Active Problem List   Diagnosis Date Noted   Folliculitis 95/62/1308   Dysmenorrhea 02/05/2020    Past Medical History:  Diagnosis Date   Abnormal uterine bleeding    Dysmenorrhea     History reviewed. No pertinent surgical history.  Current Outpatient Medications  Medication Sig Dispense  Refill   Brimonidine Tartrate (LUMIFY OP) Place 1 drop into both eyes daily.     Multiple Vitamin (MULTIVITAMIN PO) Take 2 tablets by mouth daily as needed (When Pt remembers).     naproxen sodium (ANAPROX) 550 MG tablet TAKE 1 TABLET BY MOUTH EVERY 12 HOURS AS NEEDED FOR PERIOD PAIN (Patient taking differently: Take 550 mg by mouth daily as needed (period pain).) 30 tablet 3   OVER THE COUNTER MEDICATION Take 1 tablet by mouth daily as needed (When Pt remembers). Apple Cider Vinegar Gummies     Propylene Glycol (SYSTANE COMPLETE) 0.6 % SOLN Apply to eye.     No current facility-administered medications for this visit.     ALLERGIES: Patient has no known allergies.  History reviewed. No pertinent family history.  Social History   Socioeconomic History   Marital status: Single    Spouse name: Not on file   Number of children: 0   Years of education: Not on file   Highest education level: Not on file  Occupational History   Not on file  Tobacco Use   Smoking status: Never   Smokeless tobacco: Never  Vaping Use  Vaping Use: Never used  Substance and Sexual Activity   Alcohol use: Yes    Comment: socially--2drinks/mo   Drug use: Never   Sexual activity: Yes    Birth control/protection: None  Other Topics Concern   Not on file  Social History Narrative   Not on file   Social Determinants of Health   Financial Resource Strain: Low Risk  (02/05/2020)   Overall Financial Resource Strain (CARDIA)    Difficulty of Paying Living Expenses: Not hard at all  Food Insecurity: No Food Insecurity (02/05/2020)   Hunger Vital Sign    Worried About Running Out of Food in the Last Year: Never true    Ran Out of Food in the Last Year: Never true  Transportation Needs: No Transportation Needs (02/05/2020)   PRAPARE - Hydrologist (Medical): No    Lack of Transportation (Non-Medical): No  Physical Activity: Inactive (02/05/2020)   Exercise Vital Sign    Days of  Exercise per Week: 0 days    Minutes of Exercise per Session: 0 min  Stress: No Stress Concern Present (02/05/2020)   [place]    Feeling of Stress : Not at all  Social Connections: Socially Isolated (02/05/2020)   Social Connection and Isolation Panel [NHANES]    Frequency of Communication with Friends and Family: More than three times a week    Frequency of Social Gatherings with Friends and Family: Once a week    Attends Religious Services: Never    Marine scientist or Organizations: No    Attends Archivist Meetings: Never    Marital Status: Never married  Intimate Partner Violence: Not At Risk (02/05/2020)   Humiliation, Afraid, Rape, and Kick questionnaire    Fear of Current or Ex-Partner: No    Emotionally Abused: No    Physically Abused: No    Sexually Abused: No    Review of Systems  Genitourinary:  Positive for menstrual problem (painful/heavy cycles).  All other systems reviewed and are negative.   PHYSICAL EXAMINATION:    BP 100/66   Pulse 79   Ht '5\' 4"'$  (1.626 m)   LMP 12/28/2021 (Exact Date)   SpO2 98%   BMI 24.37 kg/m     General appearance: alert, cooperative and appears stated age Head: Normocephalic, without obvious abnormality, atraumatic Neck: no adenopathy, supple, symmetrical, trachea midline and thyroid normal to inspection and palpation Lungs: clear to auscultation bilaterally Heart: regular rate and rhythm Abdomen: soft, non-tender, no masses,  no organomegaly Extremities: extremities normal, atraumatic, no cyanosis or edema Skin: Skin color, texture, turgor normal. No rashes or lesions Lymph nodes: Cervical, supraclavicular, and axillary nodes normal. No abnormal inguinal nodes palpated Neurologic: Grossly normal  Pelvic: External genitalia: prominent hair follicle of the right labia and left mons pubis.  No abscess.               Urethra:  normal appearing urethra  with no masses, tenderness or lesions              Bartholins and Skenes: normal                 Vagina: normal appearing vagina with normal color and discharge, no lesions              Cervix: no lesions                Bimanual Exam:  Uterus:  normal size, contour, position, consistency, mobility, non-tender              Adnexa: right adnexal tenderness and no mass.  No mass, fullness, or tenderness of left adnexa.              Anus:  no lesions noted.         Chaperone was present for exam:  Estill Bamberg, CMA  ASSESSMENT  Menorrhagia with regular menses.  Dysmenorrhea.  STD screening.  Folliculitis.  Anal lump.  Not present today.  Uncertain etiology.   PLAN  We discussed dysmenorrhea and menorrhagia.  List of contraceptive options for patient, the hormonal options of which may prevent pregnancy and also treat menorrhagia and dysmenorrhea. Return for pelvic US and follow up.  STD screening performed today.  She will also return if she has recurrence of vulvar or perianal lesion for further evaluation.     An After Visit Summary was printed and given to the patient.  40 min  total time was spent for this patient encounter, including preparation, face-to-face counseling with the patient, coordination of care, and documentation of the encounter.

## 2022-01-06 NOTE — Patient Instructions (Signed)
Contraception Choices Contraception, also called birth control, refers to methods or devices that prevent pregnancy. Hormonal methods  Contraceptive implant A contraceptive implant is a thin, plastic tube that contains a hormone that prevents pregnancy. It is different from an intrauterine device (IUD). It is inserted into the upper part of the arm by a health care provider. Implants can be effective for up to 3 years. Progestin-only injections Progestin-only injections are injections of progestin, a synthetic form of the hormone progesterone. They are given every 3 months by a health care provider. Birth control pills Birth control pills are pills that contain hormones that prevent pregnancy. They must be taken once a day, preferably at the same time each day. A prescription is needed to use this method of contraception. Birth control patch The birth control patch contains hormones that prevent pregnancy. It is placed on the skin and must be changed once a week for three weeks and removed on the fourth week. A prescription is needed to use this method of contraception. Vaginal ring A vaginal ring contains hormones that prevent pregnancy. It is placed in the vagina for three weeks and removed on the fourth week. After that, the process is repeated with a new ring. A prescription is needed to use this method of contraception. Emergency contraceptive Emergency contraceptives prevent pregnancy after unprotected sex. They come in pill form and can be taken up to 5 days after sex. They work best the sooner they are taken after having sex. Most emergency contraceptives are available without a prescription. This method should not be used as your only form of birth control. Barrier methods  Female condom A female condom is a thin sheath that is worn over the penis during sex. Condoms keep sperm from going inside a woman's body. They can be used with a sperm-killing substance (spermicide) to increase their  effectiveness. They should be thrown away after one use. Female condom A female condom is a soft, loose-fitting sheath that is put into the vagina before sex. The condom keeps sperm from going inside a woman's body. They should be thrown away after one use. Diaphragm A diaphragm is a soft, dome-shaped barrier. It is inserted into the vagina before sex, along with a spermicide. The diaphragm blocks sperm from entering the uterus, and the spermicide kills sperm. A diaphragm should be left in the vagina for 6-8 hours after sex and removed within 24 hours. A diaphragm is prescribed and fitted by a health care provider. A diaphragm should be replaced every 1-2 years, after giving birth, after gaining more than 15 lb (6.8 kg), and after pelvic surgery. Cervical cap A cervical cap is a round, soft latex or plastic cup that fits over the cervix. It is inserted into the vagina before sex, along with spermicide. It blocks sperm from entering the uterus. The cap should be left in place for 6-8 hours after sex and removed within 48 hours. A cervical cap must be prescribed and fitted by a health care provider. It should be replaced every 2 years. Sponge A sponge is a soft, circular piece of polyurethane foam with spermicide in it. The sponge helps block sperm from entering the uterus, and the spermicide kills sperm. To use it, you make it wet and then insert it into the vagina. It should be inserted before sex, left in for at least 6 hours after sex, and removed and thrown away within 30 hours. Spermicides Spermicides are chemicals that kill or block sperm from entering the cervix   and uterus. They can come as a cream, jelly, suppository, foam, or tablet. A spermicide should be inserted into the vagina with an applicator at least 01-60 minutes before sex to allow time for it to work. The process must be repeated every time you have sex. Spermicides do not require a prescription. Intrauterine  contraception Intrauterine device (IUD) An IUD is a T-shaped device that is put in a woman's uterus. There are two types: Hormone IUD.This type contains progestin, a synthetic form of the hormone progesterone. This type can stay in place for 3-5 years. Copper IUD.This type is wrapped in copper wire. It can stay in place for 10 years. Permanent methods of contraception Female tubal ligation In this method, a woman's fallopian tubes are sealed, tied, or blocked during surgery to prevent eggs from traveling to the uterus. Hysteroscopic sterilization In this method, a small, flexible insert is placed into each fallopian tube. The inserts cause scar tissue to form in the fallopian tubes and block them, so sperm cannot reach an egg. The procedure takes about 3 months to be effective. Another form of birth control must be used during those 3 months. Female sterilization This is a procedure to tie off the tubes that carry sperm (vasectomy). After the procedure, the man can still ejaculate fluid (semen). Another form of birth control must be used for 3 months after the procedure. Natural planning methods Natural family planning In this method, a couple does not have sex on days when the woman could become pregnant. Calendar method In this method, the woman keeps track of the length of each menstrual cycle, identifies the days when pregnancy can happen, and does not have sex on those days. Ovulation method In this method, a couple avoids sex during ovulation. Symptothermal method This method involves not having sex during ovulation. The woman typically checks for ovulation by watching changes in her temperature and in the consistency of cervical mucus. Post-ovulation method In this method, a couple waits to have sex until after ovulation. Where to find more information Centers for Disease Control and Prevention: http://www.wolf.info/ Summary Contraception, also called birth control, refers to methods or devices  that prevent pregnancy. Hormonal methods of contraception include implants, injections, pills, patches, vaginal rings, and emergency contraceptives. Barrier methods of contraception can include female condoms, female condoms, diaphragms, cervical caps, sponges, and spermicides. There are two types of IUDs (intrauterine devices). An IUD can be put in a woman's uterus to prevent pregnancy for 3-5 years. Permanent sterilization can be done through a procedure for males and females. Natural family planning methods involve nothaving sex on days when the woman could become pregnant. This information is not intended to replace advice given to you by your health care provider. Make sure you discuss any questions you have with your health care provider. Document Revised: 10/15/2019 Document Reviewed: 10/15/2019 Elsevier Patient Education  [place].   Menorrhagia Menorrhagia is a form of abnormal uterine bleeding in which menstrual periods are heavy or last longer than normal. With menorrhagia, the periods may cause enough blood loss and cramping that a woman becomes unable to take part in her usual activities. What are the causes? Common causes of this condition include: Polyps or fibroids. These are noncancerous growths in the uterus. An imbalance of the hormones estrogen and progesterone. Anovulation, which occurs when one of the ovaries does not release an egg during one or more months. A problem with the thyroid gland (hypothyroidism). Side effects of having an intrauterine device (IUD).  Side effects of some medicines, such as NSAIDs or blood thinners. A bleeding disorder that stops the blood from clotting normally. In some cases, the cause of this condition is not known. What increases the risk? You are more likely to develop this condition if you have cancer of the uterus. What are the signs or symptoms? Symptoms of this condition include: Routinely having to change your pad or tampon  every 1-2 hours because it is soaked. Needing to use pads and tampons at the same time because of heavy bleeding. Needing to wake up to change your pads or tampons during the night. Passing blood clots larger than 1 inch (2.5 cm) in size. Having bleeding that lasts for more than 7 days. Having symptoms of low iron levels (anemia), such as tiredness (fatigue) or shortness of breath. How is this diagnosed? This condition may be diagnosed based on: A physical exam. Your symptoms and menstrual history. Tests, such as: Blood tests to check if you are pregnant or if you have hormonal changes, a bleeding or thyroid disorder, anemia, or other problems. Pap test to check for cancerous changes, infections, or inflammation. Endometrial biopsy. This test involves removing a tissue sample from the lining of the uterus (endometrium) to be examined under a microscope. Pelvic ultrasound. This test uses sound waves to create images of your uterus, ovaries, and vagina. The images can show if you have fibroids or other growths. Hysteroscopy. For this test, a thin, flexible tube with a light on the end (hysteroscope) is used to look inside your uterus. How is this treated? Treatment may not be needed for this condition. If it is needed, the best treatment for you will depend on: Whether you need to prevent pregnancy. Your desire to have children in the future. The cause and severity of your bleeding. Your personal preference. Medicine Medicines are the first step in treatment. You may be treated with: Hormonal birth control methods. These treatments reduce bleeding during your menstrual period. They include: Birth control pills. Skin patch. Vaginal ring. Shots (injections) that you get every 3 months. Hormonal IUD. Implants that go under the skin. Medicines that thicken the blood and slow bleeding. Medicines that reduce swelling, such as ibuprofen. Medicines that contain an artificial (synthetic)  hormone called progestin. Medicines that make the ovaries stop working for a short time. Iron supplements to treat anemia.  Surgery If medicines do not work, surgery may be done. Surgical options may include: Dilation and curettage (D&C). In this procedure, your health care provider opens the lowest part of the uterus (cervix) and then scrapes or suctions tissue from the endometrium. This reduces menstrual bleeding. Operative hysteroscopy. In this procedure, a hysteroscope is used to view your uterus and help remove polyps that may be causing heavy periods. Endometrial ablation. This is when various techniques are used to permanently destroy your entire endometrium. After endometrial ablation, most women have little or no menstrual flow. This procedure reduces your ability to become pregnant. Endometrial resection. In this procedure, an electrosurgical wire loop is used to remove the endometrium. This procedure reduces your ability to become pregnant. Hysterectomy. This is surgical removal of your uterus. This is a permanent procedure that stops menstrual periods. Pregnancy is not possible after a hysterectomy. Follow these instructions at home: Medicines Take over-the-counter and prescription medicines only as told by your health care provider. This includes iron pills. Do not change or switch medicines without asking your health care provider. Do not take aspirin or medicines that contain aspirin  1 week before or during your menstrual period. Aspirin may make bleeding worse. Managing constipation Your iron pills may cause constipation. If you are taking prescription iron supplements, you may need to take these actions to prevent or treat constipation: Drink enough fluid to keep your urine pale yellow. Take over-the-counter or prescription medicines. Eat foods that are high in fiber, such as beans, whole grains, and fresh fruits and vegetables. Limit foods that are high in fat and processed  sugars, such as fried or sweet foods. General instructions If you need to change your sanitary pad or tampon more than once every 2 hours, limit your activity until the bleeding stops. Eat well-balanced meals, including foods that are high in iron. Foods that have a lot of iron include leafy green vegetables, meat, liver, eggs, and whole-grain breads and cereals. Do not try to lose weight until the abnormal bleeding has stopped and your blood iron level is back to normal. If you need to lose weight, work with your health care provider to lose weight safely. Keep all follow-up visits. This is important. Contact a health care provider if: You soak through a pad or tampon every 1 or 2 hours, and this happens every time you have a period. You need to use pads and tampons at the same time because you are bleeding so much. You have nausea, vomiting, diarrhea, or other problems related to medicines you are taking. Get help right away if: You soak through more than a pad or tampon in 1 hour. You pass clots bigger than 1 inch (2.5 cm) wide. You feel short of breath. You feel like your heart is beating too fast. You feel dizzy or you faint. You feel very weak or tired. Summary Menorrhagia is a form of abnormal uterine bleeding in which menstrual periods are heavy or last longer than normal. Treatment may not be needed for this condition. If it is needed, it may include medicines or procedures. Take over-the-counter and prescription medicines only as told by your health care provider. This includes iron pills. Get help right away if you have heavy bleeding that soaks through more than a pad or tampon in 1 hour, you pass large clots, or you feel dizzy, short of breath, or very weak or tired. This information is not intended to replace advice given to you by your health care provider. Make sure you discuss any questions you have with your health care provider. Document Revised: 01/22/2020 Document  Reviewed: 01/22/2020 Elsevier Patient Education  [place].  Dysmenorrhea Dysmenorrhea refers to cramps caused by the muscles of the uterus tightening (contracting) during a menstrual period. Dysmenorrhea may be mild, or it may be severe enough to interfere with everyday activities for a few days each month. Primary dysmenorrhea is menstrual cramps that last a couple of days when a female starts having menstrual periods or soon after. As a female gets older or has a baby, the cramps will usually lessen or disappear. Secondary dysmenorrhea begins later in life and is caused by a disorder in the reproductive system. It lasts longer, and it may cause more pain than primary dysmenorrhea. The pain may start before the period and last a few days after the period. What are the causes? Dysmenorrhea is usually caused by an underlying problem, such as: Endometriosis. The tissue that lines the uterus (endometrium) growing outside of the uterus in other areas of the body. Adenomyosis. Endometrial tissue growing into the muscular walls of the uterus. Pelvic congestive syndrome. Blood  vessels in the pelvis that fill with blood just before the menstrual period. Overgrowth of cells (polyps) in the endometrium or the lower part of the uterus (cervix). Uterine prolapse. The uterus dropping down into the vagina due to stretched or weak muscles. Bladder problems, such as infection or inflammation. Intestinal problems, such as a tumor or irritable bowel syndrome. Cancer of the reproductive organs or bladder. Other causes of this condition may result from: A severely tipped uterus. A cervix that is closed or has a small opening. Noncancerous (benign) tumors in the uterus (fibroids). Pelvic inflammatory disease (PID). Pelvic scarring (adhesions) from a previous surgery. An ovarian cyst. An IUD (intrauterine device). What increases the risk? You are more likely to develop this condition if: You are  younger than 23 years old. You started puberty early. You have irregular or heavy bleeding. You have never given birth. You have a family history of dysmenorrhea. You smoke or use nicotine products. You have high body weight or a low body weight. What are the signs or symptoms? Symptoms of this condition include: Cramping, throbbing pain in lower abdomen or lower back, or a feeling of fullness in the lower abdomen. Periods lasting for longer than 7 days. Headaches. Bloating. Fatigue. Nausea or vomiting. Diarrhea or loose stools. Sweating or dizziness. How is this diagnosed? This condition may be diagnosed based on: Your symptoms. Your medical history. A physical exam. Blood tests. A Pap test. This is a test in which cells from the cervix are tested for signs of cancer or infection. A pregnancy test. You may also have other tests, including: Imaging tests, such as: Ultrasound. A procedure to remove and examine a sample of endometrial tissue (dilation and curettage, D&C). A procedure to visually examine the inside of: The uterus (hysteroscopy). The abdomen or pelvis (laparoscopy). The bladder (cystoscopy). X-rays. CT scan. MRI. How is this treated? Treatment depends on the cause of the dysmenorrhea. Treatment may include medicines, such as: Pain medicines. Hormone replacement therapy. Injections of progesterone to stop the menstrual period. Birth control pills that contain the hormone progesterone. An IUD that contains the hormone progesterone. NSAIDs, such as ibuprofen. These may help to stop the production of hormones that cause cramps. Antidepressant medicines. Other treatment may include: Surgery to remove adhesions, endometriosis, ovarian cysts, fibroids, or the entire uterus (hysterectomy). Endometrial ablation. This is a procedure to destroy the endometrium. Presacral neurectomy. This is a procedure to cut the nerves in the bottom of the spine (sacrum) that go to  the reproductive organs. Sacral nerve stimulation. This is a procedure to apply an electric current to nerves in the sacrum. Exercise and physical therapy. Meditation, yoga, and acupuncture. Work with your health care provider to determine what treatment or combination of treatments is best for you. Follow these instructions at home: Relieving pain and cramping  If directed, apply heat to your lower back or abdomen when you experience pain or cramps. Use the heat source that your health care provider recommends, such as a moist heat pack or a heating pad. Place a towel between your skin and the heat source. Leave the heat on for 20-30 minutes. Remove the heat if your skin turns bright red. This is especially important if you are unable to feel pain, heat, or cold. You may have a greater risk of getting burned. Do not sleep with a heating pad on. Exercise. Activities such as walking, swimming, or biking can help to relieve cramps. Massage your lower back or abdomen to help relieve  pain. General instructions Take over-the-counter and prescription medicines only as told by your health care provider. Ask your health care provider if the medicine prescribed to you requires you to avoid driving or using machinery. Avoid alcohol and caffeine during and right before your period. These can make cramps worse. Do not use any products that contain nicotine or tobacco. These products include cigarettes, chewing tobacco, and vaping devices, such as e-cigarettes. If you need help quitting, ask your health care provider. Keep all follow-up visits. This is important. Contact a health care provider if: You have pain that gets worse or does not get better with medicine. You have pain with sex. You develop nausea or vomiting with your period that is not controlled with medicine. Get help right away if: You faint. Summary Dysmenorrhea refers to cramps caused by the muscles of the uterus tightening  (contracting) during a menstrual period. Dysmenorrhea may be mild, or it may be severe enough to interfere with everyday activities for a few days each month. Treatment depends on the cause of the dysmenorrhea. Work with your health care provider to determine what treatment or combination of treatments is best for you. This information is not intended to replace advice given to you by your health care provider. Make sure you discuss any questions you have with your health care provider. Document Revised: 12/26/2019 Document Reviewed: 12/26/2019 Elsevier Patient Education  [place].

## 2022-01-07 LAB — CERVICOVAGINAL ANCILLARY ONLY
Chlamydia: NEGATIVE
Comment: NEGATIVE
Comment: NEGATIVE
Comment: NORMAL
Neisseria Gonorrhea: NEGATIVE
Trichomonas: NEGATIVE

## 2022-01-18 MED FILL — Naproxen Sodium Tab 550 MG: ORAL | 30 days supply | Qty: 30 | Fill #1 | Status: AC

## 2022-01-19 ENCOUNTER — Other Ambulatory Visit (HOSPITAL_COMMUNITY): Payer: Self-pay

## 2022-02-04 MED FILL — Naproxen Sodium Tab 550 MG: ORAL | 30 days supply | Qty: 30 | Fill #2 | Status: CN

## 2022-02-05 ENCOUNTER — Other Ambulatory Visit (HOSPITAL_COMMUNITY): Payer: Self-pay

## 2022-02-05 MED FILL — Naproxen Sodium Tab 550 MG: ORAL | 15 days supply | Qty: 30 | Fill #2 | Status: CN

## 2022-02-08 NOTE — Progress Notes (Unsigned)
GYNECOLOGY  VISIT   HPI: 21 y.o.   Single  African American  female   [place] with Patient's last menstrual period was 01/20/2022 (exact date).   here for pelvic ultrasound.   Patient has heavy menstrual cycles.  No bleeding in between.   Notes left sided pain and pain when she is not on her cycle.  Naproxen not controlling her pain.   She is not preventing pregnancy and would welcome pregnancy.   GYNECOLOGIC HISTORY: Patient's last menstrual period was 01/20/2022 (exact date). Contraception:  none Menopausal hormone therapy:  n/a Last mammogram:  n/a Last pap smear:  02-05-20 Neg        OB History     Gravida  0   Para  0   Term  0   Preterm  0   AB  0   Living  0      SAB  0   IAB  0   Ectopic  0   Multiple  0   Live Births  0              Patient Active Problem List   Diagnosis Date Noted  . Folliculitis 71/10/2692  . Dysmenorrhea 02/05/2020    Past Medical History:  Diagnosis Date  . Abnormal uterine bleeding   . Dysmenorrhea     History reviewed. No pertinent surgical history.  Current Outpatient Medications  Medication Sig Dispense Refill  . Brimonidine Tartrate (LUMIFY OP) Place 1 drop into both eyes daily.    . Multiple Vitamin (MULTIVITAMIN PO) Take 2 tablets by mouth daily as needed (When Pt remembers).    . naproxen sodium (ANAPROX) 550 MG tablet TAKE 1 TABLET BY MOUTH EVERY 12 HOURS AS NEEDED FOR PERIOD PAIN (Patient taking differently: Take 550 mg by mouth daily as needed (period pain).) 30 tablet 3  . OVER THE COUNTER MEDICATION Take 1 tablet by mouth daily as needed (When Pt remembers). Apple Cider Vinegar Gummies    . Propylene Glycol (SYSTANE COMPLETE) 0.6 % SOLN Apply to eye.     No current facility-administered medications for this visit.     ALLERGIES: Patient has no known allergies.  History reviewed. No pertinent family history.  Social History   Socioeconomic History  . Marital status: Single    Spouse name:  Not on file  . Number of children: 0  . Years of education: Not on file  . Highest education level: Not on file  Occupational History  . Not on file  Tobacco Use  . Smoking status: Never  . Smokeless tobacco: Never  Vaping Use  . Vaping Use: Never used  Substance and Sexual Activity  . Alcohol use: Yes    Comment: socially--2drinks/mo  . Drug use: Never  . Sexual activity: Yes    Birth control/protection: None  Other Topics Concern  . Not on file  Social History Narrative  . Not on file   Social Determinants of Health   Financial Resource Strain: Low Risk  (02/05/2020)   Overall Financial Resource Strain (CARDIA)   . Difficulty of Paying Living Expenses: Not hard at all  Food Insecurity: No Food Insecurity (02/05/2020)   Hunger Vital Sign   . Worried About Charity fundraiser in the Last Year: Never true   . Ran Out of Food in the Last Year: Never true  Transportation Needs: No Transportation Needs (02/05/2020)   PRAPARE - Transportation   . Lack of Transportation (Medical): No   . Lack of  Transportation (Non-Medical): No  Physical Activity: Inactive (02/05/2020)   Exercise Vital Sign   . Days of Exercise per Week: 0 days   . Minutes of Exercise per Session: 0 min  Stress: No Stress Concern Present (02/05/2020)   [place]   . Feeling of Stress : Not at all  Social Connections: Socially Isolated (02/05/2020)   Social Connection and Isolation Panel [NHANES]   . Frequency of Communication with Friends and Family: More than three times a week   . Frequency of Social Gatherings with Friends and Family: Once a week   . Attends Religious Services: Never   . Active Member of Clubs or Organizations: No   . Attends Archivist Meetings: Never   . Marital Status: Never married  Intimate Partner Violence: Not At Risk (02/05/2020)   Humiliation, Afraid, Rape, and Kick questionnaire   . Fear of Current or  Ex-Partner: No   . Emotionally Abused: No   . Physically Abused: No   . Sexually Abused: No    Review of Systems  All other systems reviewed and are negative.   PHYSICAL EXAMINATION:    BP 100/66   Ht '5\' 4"'$  (1.626 m)   Wt 155 lb (70.3 kg)   LMP 01/20/2022 (Exact Date)   BMI 26.61 kg/m     General appearance: alert, cooperative and appears stated age Head: Normocephalic, without obvious abnormality, atraumatic Neck: no adenopathy, supple, symmetrical, trachea midline and thyroid normal to inspection and palpation Lungs: clear to auscultation bilaterally Breasts: normal appearance, no masses or tenderness, No nipple retraction or dimpling, No nipple discharge or bleeding, No axillary or supraclavicular adenopathy Heart: regular rate and rhythm Abdomen: soft, non-tender, no masses,  no organomegaly Extremities: extremities normal, atraumatic, no cyanosis or edema Skin: Skin color, texture, turgor normal. No rashes or lesions Lymph nodes: Cervical, supraclavicular, and axillary nodes normal. No abnormal inguinal nodes palpated Neurologic: Grossly normal  Pelvic: External genitalia:  no lesions              Urethra:  normal appearing urethra with no masses, tenderness or lesions              Bartholins and Skenes: normal                 Vagina: normal appearing vagina with normal color and discharge, no lesions              Cervix: no lesions                Bimanual Exam:  Uterus:  normal size, contour, position, consistency, mobility, non-tender              Adnexa: no mass, fullness, tenderness              Rectal exam: {yes no:[id number]}.  Confirms.              Anus:  normal sphincter tone, no lesions  Chaperone was present for exam:  ***  ASSESSMENT     PLAN  Consider Lysteda.   An After Visit Summary was printed and given to the patient.  ______ minutes face to face time of which over 50% was spent in counseling.

## 2022-02-09 ENCOUNTER — Encounter: Payer: Self-pay | Admitting: Obstetrics and Gynecology

## 2022-02-09 ENCOUNTER — Other Ambulatory Visit (HOSPITAL_COMMUNITY): Payer: Self-pay

## 2022-02-09 ENCOUNTER — Ambulatory Visit (INDEPENDENT_AMBULATORY_CARE_PROVIDER_SITE_OTHER): Payer: No Typology Code available for payment source

## 2022-02-09 ENCOUNTER — Ambulatory Visit (INDEPENDENT_AMBULATORY_CARE_PROVIDER_SITE_OTHER): Payer: No Typology Code available for payment source | Admitting: Obstetrics and Gynecology

## 2022-02-09 VITALS — BP 100/66 | Ht 64.0 in | Wt 155.0 lb

## 2022-02-09 DIAGNOSIS — N920 Excessive and frequent menstruation with regular cycle: Secondary | ICD-10-CM | POA: Diagnosis not present

## 2022-02-09 DIAGNOSIS — D252 Subserosal leiomyoma of uterus: Secondary | ICD-10-CM | POA: Diagnosis not present

## 2022-02-09 DIAGNOSIS — N83201 Unspecified ovarian cyst, right side: Secondary | ICD-10-CM

## 2022-02-09 DIAGNOSIS — N946 Dysmenorrhea, unspecified: Secondary | ICD-10-CM

## 2022-02-09 MED ORDER — IBUPROFEN 800 MG PO TABS
800.0000 mg | ORAL_TABLET | Freq: Three times a day (TID) | ORAL | 2 refills | Status: DC | PRN
Start: 2022-02-09 — End: 2022-06-15
  Filled 2022-02-09: qty 30, 10d supply, fill #0
  Filled 2022-04-16: qty 30, 10d supply, fill #1
  Filled 2022-06-15: qty 30, 10d supply, fill #2

## 2022-02-09 NOTE — Patient Instructions (Addendum)
Diagnostic Laparoscopy Diagnostic laparoscopy is a procedure to diagnose problems in the abdomen. It might be done for a variety of reasons, such as to look for scar tissue, a reason for abdominal pain, an abdominal mass or tumor, or fluid in the abdomen (ascites). This procedure may also be done to remove a tissue sample from the liver to look at under a microscope (biopsy). During the procedure, a thin, flexible tube that has a light and a camera on the end (laparoscope) is inserted through a small incision in the abdomen. The image from the camera is shown on a monitor to help the surgeon see inside the body. Tell a health care provider about: Any allergies you have. All medicines you are taking, including vitamins, herbs, eye drops, creams, and over-the-counter medicines. Any problems you or family members have had with anesthetic medicines. Any blood disorders you have. Any surgeries you have had. Any medical conditions you have. Whether you are pregnant or may be pregnant. What are the risks? Generally, this is a safe procedure. However, problems may occur, including: Infection. Bleeding. Allergic reactions to medicines or dyes. Damage to abdominal structures or organs, such as the intestines, liver, stomach, or spleen. What happens before the procedure? Staying hydrated Follow instructions from your health care provider about hydration, which may include: Up to 2 hours before the procedure - you may continue to drink clear liquids, such as water, clear fruit juice, black coffee, and plain tea.  Eating and drinking restrictions Follow instructions from your health care provider about eating and drinking, which may include: 8 hours before the procedure - stop eating heavy meals or foods, such as meat, fried foods, or fatty foods. 6 hours before the procedure - stop eating light meals or foods, such as toast or cereal. 6 hours before the procedure - stop drinking milk or drinks that  contain milk. 2 hours before the procedure - stop drinking clear liquids. Medicines Ask your health care provider about: Changing or stopping your regular medicines. This is especially important if you are taking diabetes medicines or blood thinners. Taking medicines such as aspirin and ibuprofen. These medicines can thin your blood. Do not take these medicines unless your health care provider tells you to take them. Taking over-the-counter medicines, vitamins, herbs, and supplements. General instructions Ask your health care provider: How your surgery site will be marked. What steps will be taken to help prevent infection. These steps may include: Removing hair at the surgery site. Washing skin with a germ-killing soap. Taking antibiotic medicine. Plan to have a responsible adult take you home from the hospital or clinic. Plan to have a responsible adult care for you for the time you are told after you leave the hospital or clinic. This is important. What happens during the procedure?  An IV will be inserted into one of your veins. You will be given one or more of the following: A medicine to help you relax (sedative). A medicine to numb the area (local anesthetic). A medicine to make you fall asleep (general anesthetic). A breathing tube will be placed down your throat to help you breathe during the procedure. Your abdomen will be filled with an air-like gas so that your abdomen expands. This will give the surgeon more room to operate and will make your organs easier to see. Many small incisions will be made in your abdomen. A laparoscope and other surgical instruments will be inserted into your abdomen through these incisions. A biopsy may be done.   This will depend on the reason why you are having this procedure. The laparoscope and other instruments will be removed from your abdomen. The air-like gas will be released from your abdomen. Your incisions will be closed with stitches  (sutures), skin glue, or surgical tapes and covered with a bandage (dressing). Your breathing tube will be removed. The procedure may vary among health care providers and hospitals. What happens after the procedure? Your blood pressure, heart rate, breathing rate, and blood oxygen level will be monitored until you leave the hospital or clinic. If you were given a sedative during the procedure, it can affect you for several hours. Do not drive or operate machinery until your health care provider says that it is safe. It is up to you to get the results of your procedure. Ask your health care provider, or the department that is doing the procedure, when your results will be ready. Summary Diagnostic laparoscopy is a procedure to diagnose problems in the abdomen using a thin, flexible tube that has a light and a camera on the end (laparoscope). Follow instructions from your health care provider about how to prepare for the procedure. Plan to have a responsible adult care for you for the time you are told after you leave the hospital or clinic. This is important. This information is not intended to replace advice given to you by your health care provider. Make sure you discuss any questions you have with your health care provider. Document Revised: 01/04/2020 Document Reviewed: 01/04/2020 Elsevier Patient Education  [employer].    Tranexamic Acid Tablets What is this medication? TRANEXAMIC ACID (TRAN ex AM ik AS id) treats heavy periods. It works by preventing blood clots from breaking down too quickly. This helps your blood clot normally, which reduces blood loss. This medicine may be used for other purposes; ask your health care provider or pharmacist if you have questions. COMMON BRAND NAME(S): Cyklokapron, Lysteda What should I tell my care team before I take this medication? They need to know if you have any of these conditions: Bleeding in the brain Blood clotting problems Kidney  disease Vision problems An unusual allergic reaction to tranexamic acid, other medications, foods, dyes, or preservatives Pregnant or trying to get pregnant Breast-feeding How should I use this medication? Take this medication by mouth with water. Take it as directed on the prescription label at the same time every day. Do not cut, crush, or chew this medication. Swallow the tablets whole. You can take it with or without food. If it upsets your stomach, take it with food. Do not take this medication until your period has started. Do not take it for more than 5 days in a row. Do not take this medication when you do not have your period. Talk to your care team about the use of this medication in children. While it may be prescribed for children as young as 12 years for selected conditions, precautions do apply. Overdosage: If you think you have taken too much of this medicine contact a poison control center or emergency room at once. NOTE: This medicine is only for you. Do not share this medicine with others. What if I miss a dose? If you miss a dose, take it as soon as you remember. Then, take your next dose at least 6 hours later. Do not take double or extra doses. What may interact with this medication? Do not take this medication with any of the following: Estrogen and progestin hormones This  medication may also interact with the following: Certain medications that prevent or treat blood clots Tretinoin This list may not describe all possible interactions. Give your health care provider a list of all the medicines, herbs, non-prescription drugs, or dietary supplements you use. Also tell them if you smoke, drink alcohol, or use illegal drugs. Some items may interact with your medicine. What should I watch for while using this medication? Visit your care team for regular checks on your progress. Tell your care team if your symptoms do not start to get better or if they get worse. This medication  can cause serious eye damage. Tell your care team right away if you have changes in your eyesight. What side effects may I notice from receiving this medication? Side effects that you should report to your care team as soon as possible: Allergic reactions--skin rash, itching, hives, swelling of the face, lips, tongue, or throat Blood clot--pain, swelling, or warmth in the leg, shortness of breath, chest pain Change in vision such as blurry vision, seeing halos around lights, vision loss Stroke--sudden numbness or weakness of the face, arm, or leg, trouble speaking, confusion, trouble walking, loss of balance or coordination, dizziness, severe headache, change in vision Side effects that usually do not require medical attention (report to your care team if they continue or are bothersome): Bone, joint, or muscle pain Fatigue Headache Runny or stuffy nose Stomach pain This list may not describe all possible side effects. Call your doctor for medical advice about side effects. You may report side effects to FDA at 1-800-FDA-1088. Where should I keep my medication? Keep out of the reach of children and pets. Store at room temperature between 20 and 25 degrees C (68 and 77 degrees F). Get rid of any unused medication after the expiration date. To get rid of medications that are no longer needed or have expired: Take the medication to a medication take-back program. Check with your pharmacy or law enforcement to find a location. If you cannot return the medication, check the label or package insert to see if the medication should be thrown out in the garbage or flushed down the toilet. If you are not sure, ask your care team. If it is safe to put it in the trash, take the medication out of the container. Mix the medication with cat litter, dirt, coffee grounds, or other unwanted substance. Seal the mixture in a bag or container. Put it in the trash. NOTE: This sheet is a summary. It may not cover all  possible information. If you have questions about this medicine, talk to your doctor, pharmacist, or health care provider.  2023 Elsevier/Gold Standard (2008-04-15 00:00:00)

## 2022-02-10 ENCOUNTER — Other Ambulatory Visit (HOSPITAL_COMMUNITY): Payer: Self-pay

## 2022-02-11 ENCOUNTER — Other Ambulatory Visit (HOSPITAL_COMMUNITY): Payer: Self-pay

## 2022-02-11 MED FILL — Naproxen Sodium Tab 550 MG: ORAL | 30 days supply | Qty: 30 | Fill #2 | Status: CN

## 2022-02-12 ENCOUNTER — Other Ambulatory Visit (HOSPITAL_COMMUNITY): Payer: Self-pay

## 2022-02-22 ENCOUNTER — Ambulatory Visit (INDEPENDENT_AMBULATORY_CARE_PROVIDER_SITE_OTHER): Payer: No Typology Code available for payment source | Admitting: Family Medicine

## 2022-02-22 ENCOUNTER — Encounter: Payer: Self-pay | Admitting: Family Medicine

## 2022-02-22 ENCOUNTER — Other Ambulatory Visit (HOSPITAL_COMMUNITY): Payer: Self-pay

## 2022-02-22 ENCOUNTER — Ambulatory Visit: Payer: Medicaid Other | Admitting: Gastroenterology

## 2022-02-22 VITALS — BP 100/66 | HR 70 | Temp 98.0°F | Resp 16 | Ht 64.0 in | Wt 168.0 lb

## 2022-02-22 DIAGNOSIS — Z1329 Encounter for screening for other suspected endocrine disorder: Secondary | ICD-10-CM

## 2022-02-22 DIAGNOSIS — Z0000 Encounter for general adult medical examination without abnormal findings: Secondary | ICD-10-CM

## 2022-02-22 DIAGNOSIS — Z13228 Encounter for screening for other metabolic disorders: Secondary | ICD-10-CM

## 2022-02-22 DIAGNOSIS — Z13220 Encounter for screening for lipoid disorders: Secondary | ICD-10-CM

## 2022-02-22 DIAGNOSIS — Z7689 Persons encountering health services in other specified circumstances: Secondary | ICD-10-CM

## 2022-02-22 DIAGNOSIS — Z130 Encounter for screening for diseases of the blood and blood-forming organs and certain disorders involving the immune mechanism: Secondary | ICD-10-CM | POA: Diagnosis not present

## 2022-02-22 MED ORDER — FLUOROMETHOLONE 0.1 % OP SUSP
OPHTHALMIC | 0 refills | Status: AC
Start: 2022-02-22 — End: ?
  Filled 2022-02-22: qty 10, 28d supply, fill #0

## 2022-02-22 MED ORDER — CYCLOSPORINE 0.05 % OP EMUL
1.0000 [drp] | Freq: Two times a day (BID) | OPHTHALMIC | 3 refills | Status: AC
  Filled 2022-02-22: qty 180, 90d supply, fill #0

## 2022-02-23 ENCOUNTER — Other Ambulatory Visit: Payer: No Typology Code available for payment source

## 2022-02-23 ENCOUNTER — Encounter: Payer: Self-pay | Admitting: *Deleted

## 2022-02-23 ENCOUNTER — Other Ambulatory Visit (HOSPITAL_COMMUNITY): Payer: Self-pay

## 2022-02-23 NOTE — Progress Notes (Signed)
New Patient Office Visit  Subjective    Patient ID: Bonnetta Allbee, female    DOB: November 07, 2000  Age: 21 y.o. MRN: [id number]  CC:  Chief Complaint  Patient presents with   Establish Care    HPI Amsi Grimley presents to establish care and for routine annual exam. Patient denies acute complaints or concerns.    Outpatient Encounter Medications as of 02/22/2022  Medication Sig   Brimonidine Tartrate (LUMIFY OP) Place 1 drop into both eyes daily.   ibuprofen (ADVIL) 800 MG tablet Take 1 tablet (800 mg total) by mouth every 8 (eight) hours as needed.   Multiple Vitamin (MULTIVITAMIN PO) Take 2 tablets by mouth daily as needed (When Pt remembers).   OVER THE COUNTER MEDICATION Take 1 tablet by mouth daily as needed (When Pt remembers). Apple Cider Vinegar Gummies   Propylene Glycol (SYSTANE COMPLETE) 0.6 % SOLN Apply to eye.   No facility-administered encounter medications on file as of 02/22/2022.    Past Medical History:  Diagnosis Date   Abnormal uterine bleeding    Dysmenorrhea    GERD (gastroesophageal reflux disease)     History reviewed. No pertinent surgical history.  History reviewed. No pertinent family history.  Social History   Socioeconomic History   Marital status: Single    Spouse name: Not on file   Number of children: 0   Years of education: Not on file   Highest education level: Not on file  Occupational History   Not on file  Tobacco Use   Smoking status: Never   Smokeless tobacco: Never  Vaping Use   Vaping Use: Never used  Substance and Sexual Activity   Alcohol use: Yes    Alcohol/week: 1.0 standard drink of alcohol    Types: 1 Glasses of wine per week    Comment: socially--2drinks/mo   Drug use: Never   Sexual activity: Yes    Birth control/protection: None  Other Topics Concern   Not on file  Social History Narrative   Not on file   Social Determinants of Health   Financial Resource Strain: Low Risk  (02/05/2020)   Overall Financial  Resource Strain (CARDIA)    Difficulty of Paying Living Expenses: Not hard at all  Food Insecurity: No Food Insecurity (02/05/2020)   Hunger Vital Sign    Worried About Running Out of Food in the Last Year: Never true    Ran Out of Food in the Last Year: Never true  Transportation Needs: No Transportation Needs (02/05/2020)   PRAPARE - Hydrologist (Medical): No    Lack of Transportation (Non-Medical): No  Physical Activity: Inactive (02/05/2020)   Exercise Vital Sign    Days of Exercise per Week: 0 days    Minutes of Exercise per Session: 0 min  Stress: No Stress Concern Present (02/05/2020)   [place]    Feeling of Stress : Not at all  Social Connections: Socially Isolated (02/05/2020)   Social Connection and Isolation Panel [NHANES]    Frequency of Communication with Friends and Family: More than three times a week    Frequency of Social Gatherings with Friends and Family: Once a week    Attends Religious Services: Never    Marine scientist or Organizations: No    Attends Archivist Meetings: Never    Marital Status: Never married  Intimate Partner Violence: Not At Risk (02/05/2020)   Humiliation, Afraid, Rape,  and Kick questionnaire    Fear of Current or Ex-Partner: No    Emotionally Abused: No    Physically Abused: No    Sexually Abused: No    Review of Systems  All other systems reviewed and are negative.       Objective    BP 100/66   Pulse 70   Temp 98 F (36.7 C) (Oral)   Resp 16   Ht '5\' 4"'  (1.626 m)   Wt 168 lb (76.2 kg)   LMP 01/20/2022 (Exact Date)   SpO2 97%   BMI 28.84 kg/m   Physical Exam Vitals and nursing note reviewed.  Constitutional:      General: She is not in acute distress. HENT:     Head: Normocephalic and atraumatic.     Right Ear: Tympanic membrane, ear canal and external ear normal.     Left Ear: Tympanic membrane, ear canal  and external ear normal.     Nose: Nose normal.     Mouth/Throat:     Mouth: Mucous membranes are moist.     Pharynx: Oropharynx is clear.  Eyes:     Conjunctiva/sclera: Conjunctivae normal.     Pupils: Pupils are equal, round, and reactive to light.  Neck:     Thyroid: No thyromegaly.  Cardiovascular:     Rate and Rhythm: Normal rate and regular rhythm.     Heart sounds: Normal heart sounds. No murmur heard. Pulmonary:     Effort: Pulmonary effort is normal. No respiratory distress.     Breath sounds: Normal breath sounds.  Abdominal:     General: There is no distension.     Palpations: Abdomen is soft. There is no mass.     Tenderness: There is no abdominal tenderness.  Musculoskeletal:        General: Normal range of motion.     Cervical back: Normal range of motion and neck supple.  Skin:    General: Skin is warm and dry.  Neurological:     General: No focal deficit present.     Mental Status: She is alert and oriented to person, place, and time.  Psychiatric:        Mood and Affect: Mood normal.        Behavior: Behavior normal.         Assessment & Plan:   1. Annual physical exam  - CMP14+EGFR  2. Screening for deficiency anemia  - CBC with Differential  3. Screening for lipid disorders  - Lipid Panel  4. Screening for endocrine/metabolic/immunity disorders  - TSH - Hemoglobin A1c  5. Encounter to establish care     Return in about 1 year (around 02/23/2023) for physical.   Becky Sax, MD

## 2022-02-24 LAB — CBC WITH DIFFERENTIAL/PLATELET
Basophils Absolute: 0 10*3/uL (ref 0.0–0.2)
Basos: 1 %
EOS (ABSOLUTE): 0.1 10*3/uL (ref 0.0–0.4)
Eos: 1 %
Hematocrit: 37.2 % (ref 34.0–46.6)
Hemoglobin: 12.1 g/dL (ref 11.1–15.9)
Immature Grans (Abs): 0 10*3/uL (ref 0.0–0.1)
Immature Granulocytes: 0 %
Lymphocytes Absolute: 2.8 10*3/uL (ref 0.7–3.1)
Lymphs: 48 %
MCH: 25.3 pg — ABNORMAL LOW (ref 26.6–33.0)
MCHC: 32.5 g/dL (ref 31.5–35.7)
MCV: 78 fL — ABNORMAL LOW (ref 79–97)
Monocytes Absolute: 0.5 10*3/uL (ref 0.1–0.9)
Monocytes: 9 %
Neutrophils Absolute: 2.4 10*3/uL (ref 1.4–7.0)
Neutrophils: 41 %
Platelets: 251 10*3/uL (ref 150–450)
RBC: 4.79 x10E6/uL (ref 3.77–5.28)
RDW: 13 % (ref 11.7–15.4)
WBC: 5.8 10*3/uL (ref 3.4–10.8)

## 2022-02-24 LAB — CMP14+EGFR
ALT: 16 IU/L (ref 0–32)
AST: 20 IU/L (ref 0–40)
Albumin/Globulin Ratio: 1.5 (ref 1.2–2.2)
Albumin: 4.7 g/dL (ref 4.0–5.0)
Alkaline Phosphatase: 74 IU/L (ref 44–121)
BUN/Creatinine Ratio: 11 (ref 9–23)
BUN: 8 mg/dL (ref 6–20)
Bilirubin Total: 0.3 mg/dL (ref 0.0–1.2)
CO2: 21 mmol/L (ref 20–29)
Calcium: 10 mg/dL (ref 8.7–10.2)
Chloride: 102 mmol/L (ref 96–106)
Creatinine, Ser: 0.74 mg/dL (ref 0.57–1.00)
Globulin, Total: 3.2 g/dL (ref 1.5–4.5)
Glucose: 89 mg/dL (ref 70–99)
Potassium: 4 mmol/L (ref 3.5–5.2)
Sodium: 138 mmol/L (ref 134–144)
Total Protein: 7.9 g/dL (ref 6.0–8.5)
eGFR: 117 mL/min/{1.73_m2} (ref >59)

## 2022-02-24 LAB — LIPID PANEL
Chol/HDL Ratio: 3.2 ratio (ref 0.0–4.4)
Cholesterol, Total: 152 mg/dL (ref 100–199)
HDL: 47 mg/dL (ref >39)
LDL Chol Calc (NIH): 79 mg/dL (ref 0–99)
Triglycerides: 147 mg/dL (ref 0–149)
VLDL Cholesterol Cal: 26 mg/dL (ref 5–40)

## 2022-02-24 LAB — HEMOGLOBIN A1C
Est. average glucose Bld gHb Est-mCnc: 114 mg/dL
Hgb A1c MFr Bld: 5.6 % (ref 4.8–5.6)

## 2022-02-24 LAB — TSH: TSH: 2.32 u[IU]/mL (ref 0.450–4.500)

## 2022-02-25 ENCOUNTER — Other Ambulatory Visit: Payer: No Typology Code available for payment source

## 2022-02-25 ENCOUNTER — Ambulatory Visit (INDEPENDENT_AMBULATORY_CARE_PROVIDER_SITE_OTHER): Payer: Self-pay | Admitting: Gastroenterology

## 2022-02-25 ENCOUNTER — Encounter: Payer: Self-pay | Admitting: Gastroenterology

## 2022-02-25 VITALS — BP 110/76 | HR 78 | Ht 64.0 in | Wt 165.0 lb

## 2022-02-25 DIAGNOSIS — R140 Abdominal distension (gaseous): Secondary | ICD-10-CM

## 2022-02-25 DIAGNOSIS — K5900 Constipation, unspecified: Secondary | ICD-10-CM

## 2022-02-25 NOTE — Patient Instructions (Signed)
If you are age 23 or older, your body mass index should be between 23-30. Your Body mass index is 28.32 kg/m. If this is out of the aforementioned range listed, please consider follow up with your Primary Care Provider.  If you are age 31 or younger, your body mass index should be between 19-25. Your Body mass index is 28.32 kg/m. If this is out of the aformentioned range listed, please consider follow up with your Primary Care Provider.   Your provider has requested that you go to the basement level for lab work before leaving today. Press "B" on the elevator. The lab is located at the first door on the left as you exit the elevator.  Please purchase Metamucil over the counter. Take as directed.   The [facility] providers would like to encourage you to use [facility] to communicate with providers for non-urgent requests or questions.  Due to long hold times on the telephone, sending your provider a message by [facility] may be a faster and more efficient way to get a response.  Please allow 48 business hours for a response.  Please remember that this is for non-urgent requests.   It was a pleasure to see you today!  Thank you for trusting me with your gastrointestinal care!    Scott E.Candis Schatz, MD

## 2022-02-25 NOTE — Progress Notes (Signed)
HPI : Paige Morales is a very pleasant 21 year old female with no significant past medical history who is referred to [place] by Dr. Dorna Mai for further management of chronic GERD symptoms.  Today, however, the patient states that her GERD symptoms (heartburn, acid regurgitation) have not been a problem for her for the past few months.  However, she is bothered by other GI symptoms, particularly constipation and bloating.  She states that she has been bothered by frequent bloating for several months now.  The bloating is often worse after eating, but sometimes is also noted on an empty stomach. The constipation is also been a problem for several months.  Where she used to go every day, now she is going about every 3 days.  Her stools are usually small and hard.  Sometimes she has straining and difficulty evacuating stools.  She will have loose stools during her menses, but otherwise diarrhea is not a problem for her. She does have episodes of left lower quadrant pain.  This pain is sharp and sometimes severe, but typically last for a few seconds.  She notices that her GI symptoms are typically more prominent when she eats at [employer].  She cooks most of her meals, which is typical [place] African fare.  She has been in [place] for 8 years now, but states that her GI issues did not start until several months ago.  She denies any changes in her diet or medications that accompanied her change in bowel habits.  Past Medical History:  Diagnosis Date   Abnormal uterine bleeding    Dysmenorrhea    GERD (gastroesophageal reflux disease)      No past surgical history on file. No family history on file. Social History   Tobacco Use   Smoking status: Never   Smokeless tobacco: Never  Vaping Use   Vaping Use: Never used  Substance Use Topics   Alcohol use: Yes    Alcohol/week: 1.0 standard drink of alcohol    Types: 1 Glasses of wine per week    Comment: socially--2drinks/mo   Drug  use: Never   Current Outpatient Medications  Medication Sig Dispense Refill   Brimonidine Tartrate (LUMIFY OP) Place 1 drop into both eyes daily.     cycloSPORINE (RESTASIS) 0.05 % ophthalmic emulsion Instill 1 drop into both eyes twice a day 180 each 3   fluorometholone (FML) 0.1 % ophthalmic suspension Instill 1 drop into both eyes as directed: 1 drop 4 times a day for 2 weeks then 1 drop 2 times a day for 2 weeks 10 mL 0   ibuprofen (ADVIL) 800 MG tablet Take 1 tablet (800 mg total) by mouth every 8 (eight) hours as needed. 30 tablet 2   Multiple Vitamin (MULTIVITAMIN PO) Take 2 tablets by mouth daily as needed (When Pt remembers).     OVER THE COUNTER MEDICATION Take 1 tablet by mouth daily as needed (When Pt remembers). Apple Cider Vinegar Gummies     Propylene Glycol (SYSTANE COMPLETE) 0.6 % SOLN Apply to eye.     No current facility-administered medications for this visit.   No Known Allergies   Review of Systems: All systems reviewed and negative except where noted in HPI.    US PELVIS TRANSVAGINAL NON-OB (TV ONLY)  Result Date: 02/19/2022 Indication:  heavy and painful menstruation. Finding: Pelvic US: Uterus 6.72 x 4.47 x 4.89 cm. 2.15 x 1.8 cm subserosal fibroid. EMS 12.90 mm.  No mass or increased blood flow  noted. Left ovary - 3.56 x 2.14 x 2.33 cm. Right ovary - 4.07 x 3.17 x 2.2 cm.  Resolving corpus luteum cyst 2.9 x 2.0 x 2.6 cm.  Also small avascular complex cyst 0.7 x 0.7 x 0.8 cm. Normal perfusion to both ovaries. No adnexal masses. Small free fluid in cul de sac.  Impression: Right ovarian cysts - one resolving CL cyst and one small complex cyst. Subserosal fibroid.    Physical Exam: Ht '5\' 4"'$  (1.626 m)   Wt 165 lb (74.8 kg)   LMP 01/20/2022 (Exact Date)   BMI 28.32 kg/m  Constitutional: Pleasant,well-developed, African-American female in no acute distress. HEENT: Normocephalic and atraumatic. Conjunctivae are normal. No scleral icterus. Cardiovascular: Normal  rate, regular rhythm.  Pulmonary/chest: Effort normal and breath sounds normal. No wheezing, rales or rhonchi. Abdominal: Soft, nondistended, nontender. Bowel sounds active throughout. There are no masses palpable. No hepatomegaly. Extremities: no edema Neurological: Alert and oriented to person place and time. Skin: Skin is warm and dry. No rashes noted. Psychiatric: Normal mood and affect. Behavior is normal.  CBC    Component Value Date/Time   WBC 5.8 02/23/2022 1634   WBC 8.0 09/04/2021 0031   RBC 4.79 02/23/2022 1634   RBC 4.45 09/04/2021 0031   HGB 12.1 02/23/2022 1634   HCT 37.2 02/23/2022 1634   PLT 251 02/23/2022 1634   MCV 78 (L) 02/23/2022 1634   MCH 25.3 (L) 02/23/2022 1634   MCH 24.7 (L) 09/04/2021 0031   MCHC 32.5 02/23/2022 1634   MCHC 31.2 09/04/2021 0031   RDW 13.0 02/23/2022 1634   LYMPHSABS 2.8 02/23/2022 1634   EOSABS 0.1 02/23/2022 1634   BASOSABS 0.0 02/23/2022 1634    CMP     Component Value Date/Time   NA 138 02/23/2022 1634   K 4.0 02/23/2022 1634   CL 102 02/23/2022 1634   CO2 21 02/23/2022 1634   GLUCOSE 89 02/23/2022 1634   GLUCOSE 109 (H) 09/04/2021 0031   BUN 8 02/23/2022 1634   CREATININE 0.74 02/23/2022 1634   CALCIUM 10.0 02/23/2022 1634   PROT 7.9 02/23/2022 1634   ALBUMIN 4.7 02/23/2022 1634   AST 20 02/23/2022 1634   ALT 16 02/23/2022 1634   ALKPHOS 74 02/23/2022 1634   BILITOT 0.3 02/23/2022 1634   GFRNONAA >60 09/04/2021 0031   GFRAA 134 02/05/2020 1430     ASSESSMENT AND PLAN: 21 year old [place] female with several months of abdominal bloating, constipation with infrequent small hard stools and fleeting sharp abdominal pain.  Symptoms worsened when she eats a Western diet.  No red flag symptoms.   Recommended patient start taking a daily fiber supplement such as Metamucil to improve her stool bulk and consistency.  Improved bowel movements will likely improve her bloating and abdominal pain.  Given infrequent and  fleeting pain, no role for Bentyl. Will check H. Pylori as it can be a common cause of bloating.   Bloating, constipation - Metamucil daily - H. Pylori stool antigen - TTG/IgA  Selda Jalbert E. Candis Schatz, MD [facility]   No ref. provider found

## 2022-02-26 LAB — IGA: Immunoglobulin A: 135 mg/dL (ref 47–310)

## 2022-02-26 LAB — TISSUE TRANSGLUTAMINASE, IGA: (tTG) Ab, IgA: <1 U/mL

## 2022-03-01 NOTE — Progress Notes (Signed)
Diane, Your test for celiac disease was negative (normal).  Please continue taking the Metamucil every day and follow-up with me as discussed to follow up on your symptoms.

## 2022-03-02 ENCOUNTER — Other Ambulatory Visit: Payer: No Typology Code available for payment source

## 2022-03-02 DIAGNOSIS — K5900 Constipation, unspecified: Secondary | ICD-10-CM

## 2022-03-02 DIAGNOSIS — R140 Abdominal distension (gaseous): Secondary | ICD-10-CM

## 2022-03-04 LAB — H. PYLORI ANTIGEN, STOOL: H pylori Ag, Stl: POSITIVE — AB

## 2022-03-04 NOTE — Progress Notes (Signed)
Paige Morales,  Your stool test was positive for the presence of H. pylori.  This bacteria can be a source of chronic abdominal pain and bloating and it can increase your risk for ulcers and stomach cancer.  We need to eradicate this with antibiotics. We will plan on treating with quad therapy as below. Please confirm no medication allergies to the prescribed regimen.   1) Omeprazole 20 mg 2 times a day x 14 d 2) Pepto Bismol 2 tabs (262 mg each) 4 times a day x 14 d 3) Metronidazole 250 mg 4 times a day x 14 d 4) doxycycline 100 mg 2 times a day x 14 d  4 weeks after treatment completed, check H. Pylori stool antigen to confirm eradication (must be off acid suppression therapy for 2 weeks prior to specimen submission)  Paige Morales, Can you please place the orders for these medications and the repeat stool antigen test?

## 2022-03-05 ENCOUNTER — Other Ambulatory Visit (HOSPITAL_COMMUNITY): Payer: Self-pay

## 2022-03-05 ENCOUNTER — Other Ambulatory Visit: Payer: Self-pay

## 2022-03-05 DIAGNOSIS — A048 Other specified bacterial intestinal infections: Secondary | ICD-10-CM

## 2022-03-05 MED ORDER — OMEPRAZOLE 20 MG PO CPDR
20.0000 mg | DELAYED_RELEASE_CAPSULE | Freq: Two times a day (BID) | ORAL | 0 refills | Status: DC
  Filled 2022-03-05: qty 28, 14d supply, fill #0

## 2022-03-05 MED ORDER — DOXYCYCLINE HYCLATE 100 MG PO CAPS
100.0000 mg | ORAL_CAPSULE | Freq: Two times a day (BID) | ORAL | 0 refills | Status: AC
  Filled 2022-03-05: qty 28, 14d supply, fill #0

## 2022-03-05 MED ORDER — BISMUTH SUBSALICYLATE 262 MG PO TABS
524.0000 mg | ORAL_TABLET | Freq: Four times a day (QID) | ORAL | 0 refills | Status: AC
  Filled 2022-03-05: qty 112, 14d supply, fill #0

## 2022-03-05 MED ORDER — METRONIDAZOLE 250 MG PO TABS
250.0000 mg | ORAL_TABLET | Freq: Four times a day (QID) | ORAL | 0 refills | Status: AC
  Filled 2022-03-05: qty 56, 14d supply, fill #0

## 2022-03-11 ENCOUNTER — Telehealth: Payer: Self-pay | Admitting: Gastroenterology

## 2022-03-11 NOTE — Telephone Encounter (Signed)
Patient states she is not feeling well and thinks it could be from the medications she is taking. Patient states she is lightheaded, shaking and stomach issues. Please advise.

## 2022-03-11 NOTE — Telephone Encounter (Signed)
Pt being treated for h pylori with flagyl, doxycycline, omeprazole and bismuth. Please see note below and advise.

## 2022-03-12 NOTE — Telephone Encounter (Signed)
Noted  

## 2022-03-15 ENCOUNTER — Other Ambulatory Visit (HOSPITAL_COMMUNITY): Payer: Self-pay

## 2022-03-15 ENCOUNTER — Other Ambulatory Visit: Payer: Self-pay

## 2022-03-15 MED ORDER — OMEPRAZOLE 20 MG PO CPDR
20.0000 mg | DELAYED_RELEASE_CAPSULE | Freq: Two times a day (BID) | ORAL | 0 refills | Status: DC
  Filled 2022-03-15: qty 28, 14d supply, fill #0

## 2022-03-15 MED ORDER — LEVOFLOXACIN 500 MG PO TABS
500.0000 mg | ORAL_TABLET | Freq: Every day | ORAL | 0 refills | Status: DC
  Filled 2022-03-15: qty 14, 14d supply, fill #0

## 2022-03-15 MED ORDER — AMOXICILLIN 500 MG PO CAPS
500.0000 mg | ORAL_CAPSULE | Freq: Three times a day (TID) | ORAL | 0 refills | Status: DC
  Filled 2022-03-15: qty 56, 14d supply, fill #0

## 2022-03-15 MED ORDER — AMOXICILLIN 250 MG PO CAPS
250.0000 mg | ORAL_CAPSULE | Freq: Three times a day (TID) | ORAL | 0 refills | Status: DC
  Filled 2022-03-15: qty 42, 14d supply, fill #0

## 2022-03-15 NOTE — Telephone Encounter (Signed)
Prescriptions sent to pharmacy and pt aware. 

## 2022-03-15 NOTE — Telephone Encounter (Signed)
Please see note below and advise. Pt still taking H pylori tx. Previous note mentions changing tx to amoxicillin and levaquin.

## 2022-03-15 NOTE — Telephone Encounter (Signed)
Patient states she is feeling shaky, nauseous and is having loose stools. Please advise.

## 2022-03-16 ENCOUNTER — Other Ambulatory Visit (HOSPITAL_COMMUNITY): Payer: Self-pay

## 2022-04-16 ENCOUNTER — Other Ambulatory Visit (HOSPITAL_COMMUNITY): Payer: Self-pay

## 2022-04-26 ENCOUNTER — Other Ambulatory Visit: Payer: No Typology Code available for payment source

## 2022-04-26 DIAGNOSIS — A048 Other specified bacterial intestinal infections: Secondary | ICD-10-CM

## 2022-04-27 ENCOUNTER — Encounter: Payer: Self-pay | Admitting: Gastroenterology

## 2022-04-27 ENCOUNTER — Ambulatory Visit (INDEPENDENT_AMBULATORY_CARE_PROVIDER_SITE_OTHER): Payer: No Typology Code available for payment source | Admitting: Gastroenterology

## 2022-04-27 ENCOUNTER — Telehealth: Payer: Self-pay

## 2022-04-27 ENCOUNTER — Other Ambulatory Visit (HOSPITAL_COMMUNITY): Payer: Self-pay

## 2022-04-27 VITALS — BP 100/72 | HR 67 | Ht 64.0 in | Wt 166.0 lb

## 2022-04-27 DIAGNOSIS — A048 Other specified bacterial intestinal infections: Secondary | ICD-10-CM

## 2022-04-27 DIAGNOSIS — K5900 Constipation, unspecified: Secondary | ICD-10-CM

## 2022-04-27 MED ORDER — POLYETHYLENE GLYCOL 3350 17 GM/SCOOP PO POWD
1.0000 | Freq: Every day | ORAL | 3 refills | Status: AC
  Filled 2022-04-27: qty 714, 2d supply, fill #0

## 2022-04-27 NOTE — Progress Notes (Signed)
HPI : Paige Morales is a very pleasant 21 year old female who I initially saw October 5 with symptoms of persistent constipation, episodic left lower quadrant abdominal pain and significant bloating.  An H. pylori stool antigen was checked and was positive.  She was treated with quadruple therapy.  She experienced some side effects from her quadruple therapy to include malaise, weakness and nausea.  Because of the side effects, she was switched to Levaquin and amoxicillin, which she completed.  She submitted her stool sample yesterday to assess for eradication of the bacteria.  At her last visit, she was recommended to take Metamucil daily to improve her constipation.  Currently, she reports her symptoms are slightly improved.  Bowel movements have improved in frequency and size.  Decreased straining. Taking Metamucil on occasion, not daily.  Doesn't like drinking the thickened liquid.  She also has a hard time swallowing large pills. Her bloating is improved somewhat since last visit.  It comes and goes. No other new symptoms.  Past Medical History:  Diagnosis Date   Abnormal uterine bleeding    Dysmenorrhea    GERD (gastroesophageal reflux disease)      History reviewed. No pertinent surgical history. No family history on file. Social History   Tobacco Use   Smoking status: Never   Smokeless tobacco: Never  Vaping Use   Vaping Use: Never used  Substance Use Topics   Alcohol use: Yes    Alcohol/week: 1.0 standard drink of alcohol    Types: 1 Glasses of wine per week    Comment: socially--2drinks/mo   Drug use: Never   Current Outpatient Medications  Medication Sig Dispense Refill   Brimonidine Tartrate (LUMIFY OP) Place 1 drop into both eyes daily.     cycloSPORINE (RESTASIS) 0.05 % ophthalmic emulsion Instill 1 drop into both eyes twice a day 180 each 3   fluorometholone (FML) 0.1 % ophthalmic suspension Instill 1 drop into both eyes as directed: 1 drop 4 times a day for 2  weeks then 1 drop 2 times a day for 2 weeks 10 mL 0   ibuprofen (ADVIL) 800 MG tablet Take 1 tablet (800 mg total) by mouth every 8 (eight) hours as needed. 30 tablet 2   Multiple Vitamin (MULTIVITAMIN PO) Take 2 tablets by mouth daily as needed (When Pt remembers).     OVER THE COUNTER MEDICATION Take 1 tablet by mouth daily as needed (When Pt remembers). Apple Cider Vinegar Gummies     Propylene Glycol (SYSTANE COMPLETE) 0.6 % SOLN Apply to eye.     omeprazole (PRILOSEC) 20 MG capsule Take 1 capsule (20 mg total) by mouth 2 (two) times daily before a meal for 14 days. 28 capsule 0   No current facility-administered medications for this visit.   No Known Allergies   Review of Systems: All systems reviewed and negative except where noted in HPI.    No results found.  Physical Exam: BP 100/72   Pulse 67   Ht '5\' 4"'$  (1.626 m)   Wt 166 lb (75.3 kg)   SpO2 99%   BMI 28.49 kg/m  Constitutional: Pleasant,well-developed, African American female in no acute distress. HEENT: Normocephalic and atraumatic. Conjunctivae are normal. No scleral icterus. Extremities: no edema Lymphadenopathy: No cervical adenopathy noted. Neurological: Alert and oriented to person place and time. Skin: Skin is warm and dry. No rashes noted. Psychiatric: Normal mood and affect. Behavior is normal.  CBC    Component Value Date/Time   WBC 5.8 02/23/2022  1634   WBC 8.0 09/04/2021 0031   RBC 4.79 02/23/2022 1634   RBC 4.45 09/04/2021 0031   HGB 12.1 02/23/2022 1634   HCT 37.2 02/23/2022 1634   PLT 251 02/23/2022 1634   MCV 78 (L) 02/23/2022 1634   MCH 25.3 (L) 02/23/2022 1634   MCH 24.7 (L) 09/04/2021 0031   MCHC 32.5 02/23/2022 1634   MCHC 31.2 09/04/2021 0031   RDW 13.0 02/23/2022 1634   LYMPHSABS 2.8 02/23/2022 1634   EOSABS 0.1 02/23/2022 1634   BASOSABS 0.0 02/23/2022 1634    CMP     Component Value Date/Time   NA 138 02/23/2022 1634   K 4.0 02/23/2022 1634   CL 102 02/23/2022 1634   CO2  21 02/23/2022 1634   GLUCOSE 89 02/23/2022 1634   GLUCOSE 109 (H) 09/04/2021 0031   BUN 8 02/23/2022 1634   CREATININE 0.74 02/23/2022 1634   CALCIUM 10.0 02/23/2022 1634   PROT 7.9 02/23/2022 1634   ALBUMIN 4.7 02/23/2022 1634   AST 20 02/23/2022 1634   ALT 16 02/23/2022 1634   ALKPHOS 74 02/23/2022 1634   BILITOT 0.3 02/23/2022 1634   GFRNONAA >60 09/04/2021 0031   GFRAA 134 02/05/2020 1430     ASSESSMENT AND PLAN: 21 year old female with chronic constipation and bloating.  She experienced some improvement with Metamucil, but did not tolerate the thickened consistency of the Metamucil powder.  She also does not like to swallow large pills.  I recommended she try taking MiraLAX.  She can start out taking it daily, and adjust her frequency to achieve a bowel movement daily.  Awaiting stool test results to see if H. pylori was successfully eradicated with partial quadruple therapy, followed by Levaquin/amoxicillin.  H. pylori infection, status posttreatment with quadruple therapy - Awaiting results of stool antigen test to assess eradication  Constipation - Start MiraLAX daily, adjust frequency as needed to achieve a bowel movement daily - Can continue Metamucil as needed  Girtha Kilgore E. Candis Schatz, [facility]  Dorna Mai, MD

## 2022-04-27 NOTE — Patient Instructions (Signed)
_______________________________________________________  If you are age 23 or older, your body mass index should be between 23-30. Your Body mass index is 28.49 kg/m. If this is out of the aforementioned range listed, please consider follow up with your Primary Care Provider.  If you are age 35 or younger, your body mass index should be between 19-25. Your Body mass index is 28.49 kg/m. If this is out of the aformentioned range listed, please consider follow up with your Primary Care Provider.   Start Miralax one capful in 8 ounces of water or juice daily.  Continue metamucil as needed.  The [facility] providers would like to encourage you to use [facility] to communicate with providers for non-urgent requests or questions.  Due to long hold times on the telephone, sending your provider a message by [facility] may be a faster and more efficient way to get a response.  Please allow 48 business hours for a response.  Please remember that this is for non-urgent requests.   It was a pleasure to see you today!  Thank you for trusting me with your gastrointestinal care!    Scott E.Candis Schatz, MD

## 2022-04-28 LAB — H. PYLORI ANTIGEN, STOOL: H pylori Ag, Stl: NEGATIVE

## 2022-04-28 NOTE — Progress Notes (Signed)
Paige Morales,  Good news, the stool antigen was negative, indicating that the bacteria was eradicated with the antibiotics.  No further treatment needed.

## 2022-05-08 ENCOUNTER — Other Ambulatory Visit (HOSPITAL_COMMUNITY): Payer: Self-pay

## 2022-05-21 ENCOUNTER — Telehealth: Payer: Self-pay

## 2022-05-28 ENCOUNTER — Other Ambulatory Visit (HOSPITAL_COMMUNITY): Payer: Self-pay

## 2022-05-28 DIAGNOSIS — H10503 Unspecified blepharoconjunctivitis, bilateral: Secondary | ICD-10-CM | POA: Diagnosis not present

## 2022-05-28 MED ORDER — TOBRAMYCIN-DEXAMETHASONE 0.3-0.1 % OP SUSP
1.0000 [drp] | Freq: Four times a day (QID) | OPHTHALMIC | 0 refills | Status: DC
  Filled 2022-05-28: qty 5, 7d supply, fill #0

## 2022-06-03 ENCOUNTER — Other Ambulatory Visit (HOSPITAL_COMMUNITY): Payer: Self-pay

## 2022-06-03 MED ORDER — TOBRAMYCIN-DEXAMETHASONE 0.3-0.1 % OP SUSP
OPHTHALMIC | 0 refills | Status: DC
  Filled 2022-06-03: qty 5, 7d supply, fill #0
  Filled 2022-06-03 (×2): qty 5, 14d supply, fill #0

## 2022-06-10 DIAGNOSIS — H0288B Meibomian gland dysfunction left eye, upper and lower eyelids: Secondary | ICD-10-CM | POA: Diagnosis not present

## 2022-06-10 DIAGNOSIS — H10503 Unspecified blepharoconjunctivitis, bilateral: Secondary | ICD-10-CM | POA: Diagnosis not present

## 2022-06-10 DIAGNOSIS — H16143 Punctate keratitis, bilateral: Secondary | ICD-10-CM | POA: Diagnosis not present

## 2022-06-15 ENCOUNTER — Other Ambulatory Visit: Payer: Self-pay | Admitting: Obstetrics and Gynecology

## 2022-06-15 ENCOUNTER — Other Ambulatory Visit (HOSPITAL_COMMUNITY): Payer: Self-pay

## 2022-06-15 MED ORDER — IBUPROFEN 800 MG PO TABS
800.0000 mg | ORAL_TABLET | Freq: Three times a day (TID) | ORAL | 1 refills | Status: DC | PRN
  Filled 2022-06-15 – 2022-08-27 (×2): qty 30, 10d supply, fill #0
  Filled 2022-11-12: qty 30, 10d supply, fill #1

## 2022-06-16 ENCOUNTER — Other Ambulatory Visit (HOSPITAL_COMMUNITY): Payer: Self-pay

## 2022-06-22 ENCOUNTER — Ambulatory Visit (INDEPENDENT_AMBULATORY_CARE_PROVIDER_SITE_OTHER): Payer: 59 | Admitting: Family Medicine

## 2022-06-22 VITALS — BP 110/72 | HR 79 | Temp 97.7°F | Resp 16 | Wt 173.0 lb

## 2022-06-22 DIAGNOSIS — R519 Headache, unspecified: Secondary | ICD-10-CM | POA: Diagnosis not present

## 2022-06-22 DIAGNOSIS — N946 Dysmenorrhea, unspecified: Secondary | ICD-10-CM

## 2022-06-22 NOTE — Progress Notes (Unsigned)
Patient is c/o headaches w/without period. And patient has sever cramps with period.   Patient has issues with left pointer finger numbness and brittle

## 2022-06-24 ENCOUNTER — Encounter: Payer: Self-pay | Admitting: Family Medicine

## 2022-06-24 NOTE — Progress Notes (Signed)
New Patient Office Visit  Subjective    Patient ID: Paige Morales, female    DOB: 2000-09-07  Age: 22 y.o. MRN: [id number]  CC:  Chief Complaint  Patient presents with   Headache   Dysmenorrhea    HPI Paige Morales presents for complaint of dysmenorrhea. She also reports intermittent headaches.    Outpatient Encounter Medications as of 06/22/2022  Medication Sig   Brimonidine Tartrate (LUMIFY OP) Place 1 drop into both eyes daily.   cycloSPORINE (RESTASIS) 0.05 % ophthalmic emulsion Instill 1 drop into both eyes twice a day   fluorometholone (FML) 0.1 % ophthalmic suspension Instill 1 drop into both eyes as directed: 1 drop 4 times a day for 2 weeks then 1 drop 2 times a day for 2 weeks   ibuprofen (ADVIL) 800 MG tablet Take 1 tablet (800 mg total) by mouth every 8 (eight) hours as needed.   Multiple Vitamin (MULTIVITAMIN PO) Take 2 tablets by mouth daily as needed (When Pt remembers).   OVER THE COUNTER MEDICATION Take 1 tablet by mouth daily as needed (When Pt remembers). Apple Cider Vinegar Gummies   polyethylene glycol powder (GLYCOLAX/MIRALAX) 17 GM/SCOOP powder Take 255 g by mouth daily for 2 days as directed.  255 grams = 1 bottle (238g) + 1 capful from other bottle.   Propylene Glycol (SYSTANE COMPLETE) 0.6 % SOLN Apply to eye.   tobramycin-dexamethasone (TOBRADEX) ophthalmic solution Place 1 drop into both eyes 4 times day for 1 week then 1 drop 2 times day for 1 week then stop   omeprazole (PRILOSEC) 20 MG capsule Take 1 capsule (20 mg total) by mouth 2 (two) times daily before a meal for 14 days.   No facility-administered encounter medications on file as of 06/22/2022.    Past Medical History:  Diagnosis Date   Abnormal uterine bleeding    Dysmenorrhea    GERD (gastroesophageal reflux disease)     No past surgical history on file.  No family history on file.  Social History   Socioeconomic History   Marital status: Single    Spouse name: Not on file   Number  of children: 0   Years of education: Not on file   Highest education level: Not on file  Occupational History   Occupation: scheduling  Tobacco Use   Smoking status: Never   Smokeless tobacco: Never  Vaping Use   Vaping Use: Never used  Substance and Sexual Activity   Alcohol use: Yes    Alcohol/week: 1.0 standard drink of alcohol    Types: 1 Glasses of wine per week    Comment: socially--2drinks/mo   Drug use: Never   Sexual activity: Yes    Birth control/protection: None  Other Topics Concern   Not on file  Social History Narrative   Not on file   Social Determinants of Health   Financial Resource Strain: Low Risk  (02/05/2020)   Overall Financial Resource Strain (CARDIA)    Difficulty of Paying Living Expenses: Not hard at all  Food Insecurity: No Food Insecurity (02/05/2020)   Hunger Vital Sign    Worried About Running Out of Food in the Last Year: Never true    Ran Out of Food in the Last Year: Never true  Transportation Needs: No Transportation Needs (02/05/2020)   PRAPARE - Hydrologist (Medical): No    Lack of Transportation (Non-Medical): No  Physical Activity: Inactive (02/05/2020)   Exercise Vital Sign    Days  of Exercise per Week: 0 days    Minutes of Exercise per Session: 0 min  Stress: No Stress Concern Present (02/05/2020)   [place]    Feeling of Stress : Not at all  Social Connections: Socially Isolated (02/05/2020)   Social Connection and Isolation Panel [NHANES]    Frequency of Communication with Friends and Family: More than three times a week    Frequency of Social Gatherings with Friends and Family: Once a week    Attends Religious Services: Never    Marine scientist or Organizations: No    Attends Archivist Meetings: Never    Marital Status: Never married  Intimate Partner Violence: Not At Risk (02/05/2020)   Humiliation, Afraid, Rape,  and Kick questionnaire    Fear of Current or Ex-Partner: No    Emotionally Abused: No    Physically Abused: No    Sexually Abused: No    Review of Systems  Neurological:  Positive for headaches.  All other systems reviewed and are negative.       Objective    BP 110/72   Pulse 79   Temp 97.7 F (36.5 C) (Oral)   Resp 16   Wt 173 lb (78.5 kg)   SpO2 97%   BMI 29.70 kg/m   Physical Exam Vitals and nursing note reviewed.  Constitutional:      General: She is not in acute distress. HENT:     Head: Normocephalic and atraumatic.  Cardiovascular:     Rate and Rhythm: Normal rate and regular rhythm.  Pulmonary:     Effort: Pulmonary effort is normal.     Breath sounds: Normal breath sounds.  Abdominal:     Palpations: Abdomen is soft.     Tenderness: There is no abdominal tenderness.  Neurological:     General: No focal deficit present.     Mental Status: She is alert and oriented to person, place, and time.         Assessment & Plan:   1. Dysmenorrhea Discussed options such as contraception to help sx. Patient defers at this time. Continue nsaids prn.  2. Nonintractable headache, unspecified chronicity pattern, unspecified headache type Recommend sx diary. Tylenol/nsaids prn.     No follow-ups on file.   Becky Sax, MD

## 2022-08-27 ENCOUNTER — Other Ambulatory Visit (HOSPITAL_COMMUNITY): Payer: Self-pay

## 2022-09-02 ENCOUNTER — Other Ambulatory Visit (HOSPITAL_COMMUNITY): Payer: Self-pay

## 2022-09-02 DIAGNOSIS — L700 Acne vulgaris: Secondary | ICD-10-CM | POA: Diagnosis not present

## 2022-09-02 DIAGNOSIS — D485 Neoplasm of uncertain behavior of skin: Secondary | ICD-10-CM | POA: Diagnosis not present

## 2022-09-02 MED ORDER — CLOBETASOL PROPIONATE 0.05 % EX SOLN
CUTANEOUS | 1 refills | Status: AC
  Filled 2022-09-02: qty 50, 30d supply, fill #0

## 2022-09-02 MED ORDER — TRETINOIN 0.05 % EX CREA
TOPICAL_CREAM | CUTANEOUS | 8 refills | Status: AC
  Filled 2022-09-02: qty 45, 30d supply, fill #0

## 2022-10-01 ENCOUNTER — Other Ambulatory Visit (HOSPITAL_COMMUNITY): Payer: Self-pay

## 2022-10-01 MED ORDER — CHLORHEXIDINE GLUCONATE 0.12 % MT SOLN
OROMUCOSAL | 1 refills | Status: DC
  Filled 2022-10-01 (×2): qty 473, 16d supply, fill #0

## 2022-10-01 MED ORDER — IBUPROFEN 600 MG PO TABS
600.0000 mg | ORAL_TABLET | Freq: Four times a day (QID) | ORAL | 0 refills | Status: DC | PRN
  Filled 2022-10-01: qty 20, 5d supply, fill #0

## 2022-10-01 MED ORDER — AMOXICILLIN 500 MG PO CAPS
500.0000 mg | ORAL_CAPSULE | Freq: Three times a day (TID) | ORAL | 0 refills | Status: DC
  Filled 2022-10-01: qty 21, 7d supply, fill #0

## 2022-10-12 ENCOUNTER — Other Ambulatory Visit (HOSPITAL_COMMUNITY): Payer: Self-pay

## 2022-10-22 ENCOUNTER — Other Ambulatory Visit: Payer: Self-pay

## 2022-10-22 ENCOUNTER — Other Ambulatory Visit (HOSPITAL_COMMUNITY): Payer: Self-pay

## 2022-10-22 HISTORY — PX: WISDOM TOOTH EXTRACTION: SHX21

## 2022-10-22 MED ORDER — AMOXICILLIN 500 MG PO CAPS
ORAL_CAPSULE | ORAL | 0 refills | Status: DC
  Filled 2022-10-22: qty 21, 7d supply, fill #0

## 2022-10-22 MED ORDER — IBUPROFEN 600 MG PO TABS
600.0000 mg | ORAL_TABLET | Freq: Four times a day (QID) | ORAL | 0 refills | Status: DC | PRN
  Filled 2022-10-22: qty 20, 4d supply, fill #0

## 2022-10-22 MED ORDER — HYDROCODONE-ACETAMINOPHEN 5-325 MG PO TABS
1.0000 | ORAL_TABLET | Freq: Four times a day (QID) | ORAL | 0 refills | Status: DC | PRN
  Filled 2022-10-22: qty 8, 2d supply, fill #0

## 2022-10-30 ENCOUNTER — Other Ambulatory Visit (HOSPITAL_COMMUNITY): Payer: Self-pay

## 2022-11-11 ENCOUNTER — Telehealth: Payer: Self-pay

## 2022-11-12 ENCOUNTER — Other Ambulatory Visit (HOSPITAL_COMMUNITY): Payer: Self-pay

## 2022-11-19 ENCOUNTER — Other Ambulatory Visit (HOSPITAL_COMMUNITY): Payer: Self-pay

## 2022-11-26 ENCOUNTER — Other Ambulatory Visit (HOSPITAL_COMMUNITY): Payer: Self-pay

## 2022-11-26 ENCOUNTER — Ambulatory Visit (HOSPITAL_COMMUNITY)
Admission: EM | Admit: 2022-11-26 | Discharge: 2022-11-26 | Disposition: A | Discharge status: Home / Self Care | Payer: 59 | Attending: Family Medicine | Admitting: Family Medicine

## 2022-11-26 ENCOUNTER — Encounter (HOSPITAL_COMMUNITY): Payer: Self-pay

## 2022-11-26 DIAGNOSIS — L03211 Cellulitis of face: Secondary | ICD-10-CM

## 2022-11-26 MED ORDER — KETOROLAC TROMETHAMINE 30 MG/ML IJ SOLN
INTRAMUSCULAR | Status: AC
  Filled 2022-11-26: qty 1

## 2022-11-26 MED ORDER — KETOROLAC TROMETHAMINE 10 MG PO TABS
10.0000 mg | ORAL_TABLET | Freq: Four times a day (QID) | ORAL | 0 refills | Status: AC | PRN
  Filled 2022-11-26 (×2): qty 20, 5d supply, fill #0

## 2022-11-26 MED ORDER — KETOROLAC TROMETHAMINE 30 MG/ML IJ SOLN
30.0000 mg | Freq: Once | INTRAMUSCULAR | Status: AC
  Administered 2022-11-26: 30 mg via INTRAMUSCULAR

## 2022-11-26 MED ORDER — AMOXICILLIN-POT CLAVULANATE 875-125 MG PO TABS
1.0000 | ORAL_TABLET | Freq: Two times a day (BID) | ORAL | 0 refills | Status: AC
Start: 2022-11-26 — End: 2022-12-03
  Filled 2022-11-26 (×2): qty 14, 7d supply, fill #0

## 2022-11-26 NOTE — Discharge Instructions (Signed)
Take amoxicillin-clavulanate 875 mg--1 tab twice daily with food for 7 days  You have been given a shot of Toradol 30 mg today.  Ketorolac 10 mg tablets--take 1 tablet every 6 hours as needed for pain.  This is the same medicine that is in the shot we just gave you  If you are worsening or not improving, please consider going to the emergency room for further evaluation

## 2022-11-26 NOTE — ED Provider Notes (Signed)
[facility]    CSN: [id number] Arrival date & time: 11/26/22  1623      History   Chief Complaint Chief Complaint  Patient presents with   Otalgia    HPI Mikaelyn Zeman is a 22 y.o. female.    Otalgia  Here for pain anterior to her left ear has been going on for about 1 week.  No cough or congestion.  No fever or chills.  No drainage from the left ear.  No itching either.  Does hurt to lie down on the left side of her head.  She has had some throbbing headache.  No allergies to medications  About 1 month ago she had her wisdom teeth out.  Last menstrual cycle was June 15    Past Medical History:  Diagnosis Date   Abnormal uterine bleeding    Dysmenorrhea    GERD (gastroesophageal reflux disease)     Patient Active Problem List   Diagnosis Date Noted   Arthralgia of left temporomandibular joint 06/03/2021   Otalgia of left ear 06/03/2021   Folliculitis 02/05/2020   Dysmenorrhea 02/05/2020    History reviewed. No pertinent surgical history.  OB History     Gravida  0   Para  0   Term  0   Preterm  0   AB  0   Living  0      SAB  0   IAB  0   Ectopic  0   Multiple  0   Live Births  0            Home Medications    Prior to Admission medications   Medication Sig Start Date End Date Taking? Authorizing Provider  amoxicillin-clavulanate (AUGMENTIN) 875-125 MG tablet Take 1 tablet by mouth 2 (two) times daily for 7 days. 11/26/22 12/03/22 Yes Lonnel Gjerde, Janace Aris, MD  ketorolac (TORADOL) 10 MG tablet Take 1 tablet (10 mg total) by mouth every 6 (six) hours as needed (pain). 11/26/22  Yes Zenia Resides, MD  Brimonidine Tartrate (LUMIFY OP) Place 1 drop into both eyes daily.    [provider]  clobetasol (TEMOVATE) 0.05 % external solution Apply to affected areas twice daily as directed for 4-6 weeks 09/02/22     cycloSPORINE (RESTASIS) 0.05 % ophthalmic emulsion Instill 1 drop into both eyes twice a day 02/22/22      fluorometholone (FML) 0.1 % ophthalmic suspension Instill 1 drop into both eyes as directed: 1 drop 4 times a day for 2 weeks then 1 drop 2 times a day for 2 weeks 02/22/22     Multiple Vitamin (MULTIVITAMIN PO) Take 2 tablets by mouth daily as needed (When Pt remembers).    [provider]  OVER THE COUNTER MEDICATION Take 1 tablet by mouth daily as needed (When Pt remembers). Apple Cider Vinegar Gummies    [provider]  polyethylene glycol powder (GLYCOLAX/MIRALAX) 17 GM/SCOOP powder Take 255 g by mouth daily for 2 days as directed.  255 grams = 1 bottle (238g) + 1 capful from other bottle. 04/27/22   Jenel Lucks, MD  tretinoin (RETIN-A) 0.05 % cream Apply a pea size amount to the entire face every other night then increase to nightly as tolerated. 09/02/22       Family History History reviewed. No pertinent family history.  Social History Social History   Tobacco Use   Smoking status: Never   Smokeless tobacco: Never  Vaping Use   Vaping Use: Never  used  Substance Use Topics   Alcohol use: Yes    Alcohol/week: 1.0 standard drink of alcohol    Types: 1 Glasses of wine per week    Comment: socially--2drinks/mo   Drug use: Never     Allergies   Patient has no known allergies.   Review of Systems Review of Systems  HENT:  Positive for ear pain.      Physical Exam Triage Vital Signs ED Triage Vitals  Enc Vitals Group     BP 11/26/22 1711 112/63     Pulse Rate 11/26/22 1711 60     Resp 11/26/22 1711 16     Temp 11/26/22 1711 97.9 F (36.6 C)     Temp Source 11/26/22 1711 Oral     SpO2 11/26/22 1711 98 %     Weight --      Height --      Head Circumference --      Peak Flow --      Pain Score 11/26/22 1712 8     Pain Loc --      Pain Edu? --      Excl. in GC? --    No data found.  Updated Vital Signs BP 112/63 (BP Location: Left Arm)   Pulse 60   Temp 97.9 F (36.6 C) (Oral)   Resp 16   LMP 11/04/2022 (Approximate)   SpO2 98%    Visual Acuity Right Eye Distance:   Left Eye Distance:   Bilateral Distance:    Right Eye Near:   Left Eye Near:    Bilateral Near:     Physical Exam Vitals reviewed.  Constitutional:      General: She is not in acute distress.    Appearance: She is not ill-appearing, toxic-appearing or diaphoretic.  HENT:     Right Ear: Tympanic membrane and ear canal normal.     Left Ear: Tympanic membrane and ear canal normal.     Ears:     Comments: The tragus is tender as is the area just anterior to it on the left external ear.  No obvious erythema or induration.  The ear canal is not swollen and the tympanic membrane is gray and shiny on the left.  Right ear is normal    Mouth/Throat:     Mouth: Mucous membranes are moist.  Eyes:     Extraocular Movements: Extraocular movements intact.     Pupils: Pupils are equal, round, and reactive to light.  Cardiovascular:     Rate and Rhythm: Normal rate and regular rhythm.     Heart sounds: No murmur heard. Pulmonary:     Effort: Pulmonary effort is normal.     Breath sounds: Normal breath sounds.  Musculoskeletal:     Cervical back: Neck supple.  Lymphadenopathy:     Cervical: No cervical adenopathy.  Skin:    Coloration: Skin is not pale.  Neurological:     General: No focal deficit present.     Mental Status: She is alert and oriented to person, place, and time.  Psychiatric:        Behavior: Behavior normal.      UC Treatments / Results  Labs (all labs ordered are listed, but only abnormal results are displayed) Labs Reviewed - No data to display  EKG   Radiology No results found.  Procedures Procedures (including critical care time)  Medications Ordered in UC Medications  ketorolac (TORADOL) 30 MG/ML injection 30 mg (has no administration in time  range)    Initial Impression / Assessment and Plan / UC Course  I have reviewed the triage vital signs and the nursing notes.  Pertinent labs & imaging results that  were available during my care of the patient were reviewed by me and considered in my medical decision making (see chart for details).        This does not appear to be an external otitis.  With her history of recent dental surgery and the tenderness and pain in the cheek near the ear, I am going to treat for possible cellulitis with Augmentin.  If she is not improving or if she is worsening, she is to proceed to the emergency room  Toradol is given here and then Toradol tablets are sent in for pain relief Final Clinical Impressions(s) / UC Diagnoses   Final diagnoses:  Cellulitis of face     Discharge Instructions      Take amoxicillin-clavulanate 875 mg--1 tab twice daily with food for 7 days  You have been given a shot of Toradol 30 mg today.  Ketorolac 10 mg tablets--take 1 tablet every 6 hours as needed for pain.  This is the same medicine that is in the shot we just gave you  If you are worsening or not improving, please consider going to the emergency room for further evaluation     ED Prescriptions     Medication Sig Dispense Auth. Provider   amoxicillin-clavulanate (AUGMENTIN) 875-125 MG tablet Take 1 tablet by mouth 2 (two) times daily for 7 days. 14 tablet Janett Kamath, Janace Aris, MD   ketorolac (TORADOL) 10 MG tablet Take 1 tablet (10 mg total) by mouth every 6 (six) hours as needed (pain). 20 tablet Pryor Guettler, Janace Aris, MD      PDMP not reviewed this encounter.   Zenia Resides, MD 11/26/22 [phone]

## 2022-11-26 NOTE — ED Triage Notes (Signed)
Pt states left ear pain that causes her head to hurt for the past week. States she took Tylenol once with no relief.

## 2022-11-30 ENCOUNTER — Other Ambulatory Visit (HOSPITAL_COMMUNITY): Payer: Self-pay

## 2022-12-03 ENCOUNTER — Ambulatory Visit: Payer: Self-pay

## 2022-12-03 NOTE — Telephone Encounter (Signed)
  Chief Complaint: Ear pain 10/10 Symptoms: above Frequency: before 7/5 Pertinent Negatives: Patient denies  Disposition: [] ED /[x] Urgent Care (no appt availability in office) / [] Appointment(In office/virtual)/ []  [place] [facility]/ [] Home Care/ [] Refused Recommended Disposition /[] [place] [facility]/ []  Follow-up with PCP Additional Notes: Pt was seen at [facility] on 11/26/2022 for ear pain and was given ABX and pain medication. Pt states that ear pain never resolved. No appts in office. Pt advised to return to UC for care.    Reason for Disposition  [1] SEVERE pain AND [2] not improved 2 hours after taking analgesic medication (e.g., ibuprofen or acetaminophen)  Answer Assessment - Initial Assessment Questions 1. LOCATION: "Which ear is involved?"     Left 2. ONSET: "When did the ear start hurting"      Before 7/5 3. SEVERITY: "How bad is the pain?"  (Scale 1-10; mild, moderate or severe)   - MILD (1-3): doesn't interfere with normal activities    - MODERATE (4-7): interferes with normal activities or awakens from sleep    - SEVERE (8-10): excruciating pain, unable to do any normal activities      10/10  Protocols used: Davina Poke

## 2022-12-09 ENCOUNTER — Telehealth: Payer: Self-pay | Admitting: Family Medicine

## 2022-12-09 NOTE — Telephone Encounter (Signed)
Copied from CRM [id number]. Topic: Appointment Scheduling - Scheduling Inquiry for Clinic >> Dec 07, 2022  2:33 PM Runell Gess P wrote: Reason for CRM: pt needs a CPE before sept.  There is nothing right now until after sept.  CB#[phone] >> Dec 07, 2022  2:39 PM CMA Priscille Heidelberg wrote: Please assist

## 2022-12-09 NOTE — Telephone Encounter (Signed)
Called patient to schedule appointment no answer left message to call back to schedule physical

## 2022-12-13 ENCOUNTER — Ambulatory Visit: Payer: Self-pay

## 2022-12-13 ENCOUNTER — Telehealth: Payer: Self-pay | Admitting: Family Medicine

## 2022-12-13 NOTE — Telephone Encounter (Signed)
Patient called back as she is still experiencing blood coming out of her left ear and really needs to be seen sooner than Aug 20th. She went to the Urgent Care on July 5th and they told her it was an ear infection. From that time her pain level was 6 and now she is worse as her pain lever has gone up. She is very distant as she can't process what's being said to her for a few seconds and then she's dizzy. Please f/u with patient as she is having difficulty doing her job.

## 2022-12-13 NOTE — Telephone Encounter (Signed)
     Chief Complaint: Left ear pain, saw blood this morning. Headache, dizziness. Asking to be worked in. Symptoms: Above Frequency: 11/26/22 Pertinent Negatives: Patient denies fever Disposition: [] ED /[] Urgent Care (no appt availability in office) / [] Appointment(In office/virtual)/ []  [place] [facility]/ [] Home Care/ [] Refused Recommended Disposition /[] [place] [facility]/ [x]  Follow-up with PCP Additional Notes: Please advise pt.  Reason for Disposition  [1] SEVERE pain AND [2] not improved 2 hours after taking analgesic medication (e.g., ibuprofen or acetaminophen)  Answer Assessment - Initial Assessment Questions 1. LOCATION: "Which ear is involved?"     Left ear 2. ONSET: "When did the ear start hurting"      11/26/22 3. SEVERITY: "How bad is the pain?"  (Scale 1-10; mild, moderate or severe)   - MILD (1-3): doesn't interfere with normal activities    - MODERATE (4-7): interferes with normal activities or awakens from sleep    - SEVERE (8-10): excruciating pain, unable to do any normal activities      Throbbing - 10 4. URI SYMPTOMS: "Do you have a runny nose or cough?"     Headache 5. FEVER: "Do you have a fever?" If Yes, ask: "What is your temperature, how was it measured, and when did it start?"     None today 6. CAUSE: "Have you been swimming recently?", "How often do you use Q-TIPS?", "Have you had any recent air travel or scuba diving?"     No 7. OTHER SYMPTOMS: "Do you have any other symptoms?" (e.g., headache, stiff neck, dizziness, vomiting, runny nose, decreased hearing)     Headache,dizziness 8. PREGNANCY: "Is there any chance you are pregnant?" "When was your last menstrual period?"     No  Protocols used: Davina Poke

## 2022-12-13 NOTE — Telephone Encounter (Signed)
FYI Patient scheduled for earlier appointment

## 2022-12-13 NOTE — Telephone Encounter (Signed)
Patient scheduled earlier appointment

## 2022-12-14 ENCOUNTER — Ambulatory Visit (INDEPENDENT_AMBULATORY_CARE_PROVIDER_SITE_OTHER): Payer: 59 | Admitting: Family

## 2022-12-14 ENCOUNTER — Other Ambulatory Visit (HOSPITAL_COMMUNITY): Payer: Self-pay

## 2022-12-14 VITALS — BP 110/68 | HR 75 | Temp 98.4°F | Resp 12 | Ht 64.0 in | Wt 171.0 lb

## 2022-12-14 DIAGNOSIS — H9202 Otalgia, left ear: Secondary | ICD-10-CM

## 2022-12-14 MED ORDER — NEOMYCIN-POLYMYXIN-HC 3.5-10000-1 OT SUSP
4.0000 [drp] | Freq: Every day | OTIC | 0 refills | Status: AC
Start: 2022-12-14 — End: ?
  Filled 2022-12-14: qty 10, 30d supply, fill #0

## 2022-12-14 MED ORDER — PREDNISONE 10 MG PO TABS
ORAL_TABLET | ORAL | 0 refills | Status: AC
  Filled 2022-12-14: qty 21, 6d supply, fill #0

## 2022-12-14 NOTE — Progress Notes (Signed)
Pt is here for ear pain   Complaining of left ear pain for X1 month   Bleeding from ear X1 episode of same happened Yesterday

## 2022-12-14 NOTE — Progress Notes (Addendum)
Patient ID: Paige Morales, female    DOB: March 29, 2001  MRN: [id number]  CC: Earache  Subjective: Paige Morales is a 22 y.o. female who presents for earache.  Her concerns today include:  Reports left earache x 1 month. Reports she was recently seen at Urgent Care and prescribed Amoxicillin and Toradol for home. Reports also given Toradol injection while at Urgent Care. Reports medications given and prescribed at Urgent Care made symptoms worse. Today reports left earache persisting on intermittent basis. States left ear feels like its "throbbing" and difficulty hearing. Reports on yesterday she had one occurrence of blood on Q Tip while cleaning her left ear. She denies associated red flag symptoms. She is using an over-the-counter ear drop which has provided some relief. Reports she has an upcoming appointment with ENT on next week. States she prefers medication for earache to be liquid. Right ear normal. No further issues/concerns for discussion today.   Patient Active Problem List   Diagnosis Date Noted   Arthralgia of left temporomandibular joint 06/03/2021   Otalgia of left ear 06/03/2021   Folliculitis 02/05/2020   Dysmenorrhea 02/05/2020     Current Outpatient Medications on File Prior to Visit  Medication Sig Dispense Refill   Brimonidine Tartrate (LUMIFY OP) Place 1 drop into both eyes daily.     clobetasol (TEMOVATE) 0.05 % external solution Apply to affected areas twice daily as directed for 4-6 weeks 50 mL 1   cycloSPORINE (RESTASIS) 0.05 % ophthalmic emulsion Instill 1 drop into both eyes twice a day 180 each 3   fluorometholone (FML) 0.1 % ophthalmic suspension Instill 1 drop into both eyes as directed: 1 drop 4 times a day for 2 weeks then 1 drop 2 times a day for 2 weeks 10 mL 0   ketorolac (TORADOL) 10 MG tablet Take 1 tablet (10 mg) by mouth every 6 hours as needed (pain). 20 tablet 0   Multiple Vitamin (MULTIVITAMIN PO) Take 2 tablets by mouth daily as needed (When Pt  remembers).     OVER THE COUNTER MEDICATION Take 1 tablet by mouth daily as needed (When Pt remembers). Apple Cider Vinegar Gummies     polyethylene glycol powder (GLYCOLAX/MIRALAX) 17 GM/SCOOP powder Take 255 g by mouth daily for 2 days as directed.  255 grams = 1 bottle (238g) + 1 capful from other bottle. 714 g 3   tretinoin (RETIN-A) 0.05 % cream Apply a pea size amount to the entire face every other night then increase to nightly as tolerated. 45 g 8   No current facility-administered medications on file prior to visit.    No Known Allergies  Social History   Socioeconomic History   Marital status: Single    Spouse name: Not on file   Number of children: 0   Years of education: Not on file   Highest education level: Associate degree: occupational, Scientist, product/process development, or vocational program  Occupational History   Occupation: scheduling  Tobacco Use   Smoking status: Never   Smokeless tobacco: Never  Vaping Use   Vaping status: Never Used  Substance and Sexual Activity   Alcohol use: Yes    Alcohol/week: 1.0 standard drink of alcohol    Types: 1 Glasses of wine per week    Comment: socially--2drinks/mo   Drug use: Never   Sexual activity: Yes    Birth control/protection: None  Other Topics Concern   Not on file  Social History Narrative   Not on file   Social Determinants  of Health   Financial Resource Strain: Medium Risk (12/14/2022)   Overall Financial Resource Strain (CARDIA)    Difficulty of Paying Living Expenses: Somewhat hard  Food Insecurity: Food Insecurity Present (12/14/2022)   Hunger Vital Sign    Worried About Running Out of Food in the Last Year: Sometimes true    Ran Out of Food in the Last Year: Sometimes true  Transportation Needs: No Transportation Needs (12/14/2022)   PRAPARE - Administrator, Civil Service (Medical): No    Lack of Transportation (Non-Medical): No  Physical Activity: Unknown (12/14/2022)   Exercise Vital Sign    Days of  Exercise per Week: 0 days    Minutes of Exercise per Session: Not on file  Stress: Stress Concern Present (12/14/2022)   [employer] of Occupational Health - Occupational Stress Questionnaire    Feeling of Stress : To some extent  Social Connections: Unknown (12/14/2022)   Social Connection and Isolation Panel [NHANES]    Frequency of Communication with Friends and Family: Three times a week    Frequency of Social Gatherings with Friends and Family: Once a week    Attends Religious Services: Patient declined    Database administrator or Organizations: Patient declined    Attends Banker Meetings: Not on file    Marital Status: Never married  Intimate Partner Violence: Unknown (08/27/2021)   Received from [employer], [facility]   HITS    Physically Hurt: Not on file    Insult or Talk Down To: Not on file    Threaten Physical Harm: Not on file    Scream or Curse: Not on file    No family history on file.  No past surgical history on file.  ROS: Review of Systems Negative except as stated above  PHYSICAL EXAM: BP 110/68 (BP Location: Left Arm, Patient Position: Sitting, Cuff Size: Normal)   Pulse 75   Temp 98.4 F (36.9 C)   Resp 12   Ht 5\' 4"  (1.626 m)   Wt 171 lb (77.6 kg)   LMP 12/05/2022 (Approximate)   SpO2 99%   BMI 29.35 kg/m   Physical Exam HENT:     Head: Normocephalic and atraumatic.     Right Ear: Tympanic membrane, ear canal and external ear normal.     Left Ear: Tympanic membrane, ear canal and external ear normal.     Nose: Nose normal.     Mouth/Throat:     Mouth: Mucous membranes are moist.     Pharynx: Oropharynx is clear.  Eyes:     Extraocular Movements: Extraocular movements intact.     Conjunctiva/sclera: Conjunctivae normal.     Pupils: Pupils are equal, round, and reactive to light.  Cardiovascular:     Rate and Rhythm: Normal rate and regular rhythm.     Pulses: Normal pulses.     Heart sounds: Normal heart sounds.   Pulmonary:     Effort: Pulmonary effort is normal.     Breath sounds: Normal breath sounds.  Musculoskeletal:        General: Normal range of motion.     Cervical back: Normal range of motion and neck supple.  Neurological:     General: No focal deficit present.     Mental Status: She is alert and oriented to person, place, and time.  Psychiatric:        Mood and Affect: Mood normal.        Behavior: Behavior normal.  ASSESSMENT AND PLAN: Note - I consulted with Georganna Skeans, MD patient's plan of care.   1. Otalgia of left ear - Cortisporin otic solution and Prednisone as prescribed. Counseled on medication adherence/adverse effects.  - Keep all scheduled appointments with ENT. During the interim follow-up with primary provider as scheduled. - predniSONE (DELTASONE) 10 MG tablet; Take 6 tablets by mouth with breakfast for 1 day, THEN decrease by 1 tablet daily until finished (6-5-4-3-2-1)  Dispense: 21 tablet; Refill: 0 - neomycin-polymyxin-hydrocortisone (CORTISPORIN) 3.5-10000-1 OTIC suspension; Place 4 drops into the left ear daily. Take for 7 days.  Dispense: 10 mL; Refill: 0    Patient was given the opportunity to ask questions.  Patient verbalized understanding of the plan and was able to repeat key elements of the plan. Patient was given clear instructions to go to Emergency Department or return to medical center if symptoms don't improve, worsen, or new problems develop.The patient verbalized understanding.    Requested Prescriptions   Signed Prescriptions Disp Refills   predniSONE (DELTASONE) 10 MG tablet 21 tablet 0    Sig: Take 6 tablets by mouth with breakfast for 1 day, THEN decrease by 1 tablet daily until finished (6-5-4-3-2-1)   neomycin-polymyxin-hydrocortisone (CORTISPORIN) 3.5-10000-1 OTIC suspension 10 mL 0    Sig: Place 4 drops into the left ear daily. Take for 7 days.    Return in about 1 week (around 12/21/2022) for Follow-Up or next available with  Georganna Skeans, MD.  Rema Fendt, NP

## 2022-12-15 ENCOUNTER — Emergency Department (HOSPITAL_COMMUNITY)
Admission: EM | Admit: 2022-12-15 | Discharge: 2022-12-16 | Disposition: A | Discharge status: Home / Self Care | Payer: 59 | Attending: Emergency Medicine | Admitting: Emergency Medicine

## 2022-12-15 ENCOUNTER — Other Ambulatory Visit: Payer: Self-pay

## 2022-12-15 ENCOUNTER — Encounter (HOSPITAL_COMMUNITY): Payer: Self-pay

## 2022-12-15 DIAGNOSIS — R519 Headache, unspecified: Secondary | ICD-10-CM | POA: Diagnosis not present

## 2022-12-15 MED ORDER — KETOROLAC TROMETHAMINE 15 MG/ML IJ SOLN
15.0000 mg | Freq: Once | INTRAMUSCULAR | Status: AC
  Administered 2022-12-16: 15 mg via INTRAVENOUS
  Filled 2022-12-15: qty 1

## 2022-12-15 MED ORDER — SODIUM CHLORIDE 0.9 % IV BOLUS
1000.0000 mL | Freq: Once | INTRAVENOUS | Status: AC
  Administered 2022-12-16: 1000 mL via INTRAVENOUS

## 2022-12-15 MED ORDER — DIPHENHYDRAMINE HCL 50 MG/ML IJ SOLN
25.0000 mg | Freq: Once | INTRAMUSCULAR | Status: AC
  Administered 2022-12-16: 25 mg via INTRAVENOUS
  Filled 2022-12-15: qty 1

## 2022-12-15 MED ORDER — METOCLOPRAMIDE HCL 5 MG/ML IJ SOLN
10.0000 mg | Freq: Once | INTRAMUSCULAR | Status: AC
  Administered 2022-12-16: 10 mg via INTRAVENOUS
  Filled 2022-12-15: qty 2

## 2022-12-15 NOTE — ED Notes (Signed)
Physician notified that med admin and lab collection delayed d/t x2 attempts that were unsuccessful for IV placement by this nurse. Charge nurse notified and another nurse requested to attempt successful IV placement at this time.

## 2022-12-15 NOTE — ED Triage Notes (Signed)
Pt states that she has been having L ear pain for one month, and that Monday she started having blood in her ear, was given ear drops and steroids and is now having headaches.

## 2022-12-15 NOTE — ED Provider Notes (Signed)
[place] EMERGENCY DEPARTMENT AT [facility] Provider Note   CSN: [id number] Arrival date & time: 12/15/22  2034     History  Chief Complaint  Patient presents with   Headache   Ear Pain    Paige Morales is a 22 y.o. female.  HPI 22 year old female presents for evaluation of headache and left ear pain. She's had left ear pain for a month. Went to urgent care and was give toradol Rx and Amoxicillin. Went to PCP and started on prednisone and ear drops yesterday. However, the left ear still hurts, had some bleeding a couple days ago, and it feels like she has decreased/muffled hearing out of it.  She has had 2 weeks of headache.  The headache is diffuse throughout her head though not involving her occiput.  No fevers, neck pain.  She had some blurry vision last week but none now.  No double vision.  No weakness or numbness in her extremities.  Denies any neck pain/stiffness.  Headache is constant throughout the day and not worse first thing in the morning.  There is no vomiting.  No trauma.  Home Medications Prior to Admission medications   Medication Sig Start Date End Date Taking? Authorizing Provider  Brimonidine Tartrate (LUMIFY OP) Place 1 drop into both eyes daily.    [provider]  clobetasol (TEMOVATE) 0.05 % external solution Apply to affected areas twice daily as directed for 4-6 weeks 09/02/22     cycloSPORINE (RESTASIS) 0.05 % ophthalmic emulsion Instill 1 drop into both eyes twice a day 02/22/22     fluorometholone (FML) 0.1 % ophthalmic suspension Instill 1 drop into both eyes as directed: 1 drop 4 times a day for 2 weeks then 1 drop 2 times a day for 2 weeks 02/22/22     ketorolac (TORADOL) 10 MG tablet Take 1 tablet (10 mg) by mouth every 6 hours as needed (pain). 11/26/22   Zenia Resides, MD  Multiple Vitamin (MULTIVITAMIN PO) Take 2 tablets by mouth daily as needed (When Pt remembers).    [provider]  neomycin-polymyxin-hydrocortisone  (CORTISPORIN) 3.5-10000-1 OTIC suspension Place 4 drops into the left ear daily. Take for 7 days. 12/14/22   Rema Fendt, NP  OVER THE COUNTER MEDICATION Take 1 tablet by mouth daily as needed (When Pt remembers). Apple Cider Vinegar Gummies    [provider]  polyethylene glycol powder (GLYCOLAX/MIRALAX) 17 GM/SCOOP powder Take 255 g by mouth daily for 2 days as directed.  255 grams = 1 bottle (238g) + 1 capful from other bottle. 04/27/22   Jenel Lucks, MD  predniSONE (DELTASONE) 10 MG tablet Take 6 tablets by mouth with breakfast for 1 day, THEN decrease by 1 tablet daily until finished (6-5-4-3-2-1) 12/14/22 12/21/22  Rema Fendt, NP  tretinoin (RETIN-A) 0.05 % cream Apply a pea size amount to the entire face every other night then increase to nightly as tolerated. 09/02/22         Allergies    Patient has no known allergies.    Review of Systems   Review of Systems  Constitutional:  Positive for fatigue. Negative for fever.  HENT:  Positive for ear pain.   Gastrointestinal:  Positive for nausea. Negative for vomiting.  Musculoskeletal:  Negative for neck pain and neck stiffness.  Neurological:  Positive for headaches. Negative for weakness and numbness.    Physical Exam Updated Vital Signs BP 118/61 (BP Location: Left Arm)   Pulse  66   Temp 98.2 F (36.8 C) (Oral)   Resp 18   LMP 12/05/2022 (Approximate)   SpO2 96%  Physical Exam Vitals and nursing note reviewed.  Constitutional:      Appearance: She is well-developed.  HENT:     Head: Normocephalic and atraumatic.     Right Ear: Tympanic membrane and ear canal normal.     Left Ear: Tympanic membrane and ear canal normal. No mastoid tenderness.     Ears:     Comments: There is perhaps some mild fluid behind left TM. However, there is no otitis externa or media. She has some tenderness when I use the otoscope in her canal but no obvious lesions or swelling. No mastoid tenderness. Eyes:     Extraocular  Movements: Extraocular movements intact.     Pupils: Pupils are equal, round, and reactive to light.  Cardiovascular:     Rate and Rhythm: Normal rate and regular rhythm.     Heart sounds: Normal heart sounds.  Pulmonary:     Effort: Pulmonary effort is normal.     Breath sounds: Normal breath sounds.  Abdominal:     Palpations: Abdomen is soft.     Tenderness: There is no abdominal tenderness.  Musculoskeletal:     Cervical back: Normal range of motion. No rigidity.  Skin:    General: Skin is warm and dry.  Neurological:     Mental Status: She is alert.     Comments: CN 3-12 grossly intact. 5/5 strength in all 4 extremities. Grossly normal sensation. Normal finger to nose.      ED Results / Procedures / Treatments   Labs (all labs ordered are listed, but only abnormal results are displayed) Labs Reviewed  COMPREHENSIVE METABOLIC PANEL  HCG, SERUM, QUALITATIVE  CBC WITH DIFFERENTIAL/PLATELET    EKG None  Radiology No results found.  Procedures Procedures    Medications Ordered in ED Medications  sodium chloride 0.9 % bolus 1,000 mL (has no administration in time range)  ketorolac (TORADOL) 15 MG/ML injection 15 mg (has no administration in time range)  metoCLOPramide (REGLAN) injection 10 mg (has no administration in time range)  diphenhydrAMINE (BENADRYL) injection 25 mg (has no administration in time range)    ED Course/ Medical Decision Making/ A&P                             Medical Decision Making Amount and/or Complexity of Data Reviewed External Data Reviewed: notes. Labs: ordered.  Risk Prescription drug management.   Patient's neuroexam is unremarkable.  Normal vitals.  No meningismus.  Given all this, I have a low suspicion that she needs a head CT.  I discussed this at length with patient and significant other.  She describes transient blurry vision but no double vision.  I do not think stroke, mass, infection, venous thrombosis, etc. is very  likely.  Will treat her headache and check some blood work.  After discussion of risk/potential benefits of head CT she decided to hold off on head CT.  Care transferred to Dr. Eudelia Bunch.        Final Clinical Impression(s) / ED Diagnoses Final diagnoses:  None    Rx / DC Orders ED Discharge Orders     None         Pricilla Loveless, MD 12/15/22 [phone]

## 2022-12-16 LAB — CBC WITH DIFFERENTIAL/PLATELET
Abs Immature Granulocytes: 0.04 10*3/uL (ref 0.00–0.07)
Basophils Absolute: 0 10*3/uL (ref 0.0–0.1)
Eosinophils Absolute: 0 10*3/uL (ref 0.0–0.5)
Eosinophils Relative: 0 %
Hemoglobin: 12.2 g/dL (ref 12.0–15.0)
Immature Granulocytes: 1 %
Lymphocytes Relative: 21 %
Lymphs Abs: 1.7 10*3/uL (ref 0.7–4.0)
MCHC: 31 g/dL (ref 30.0–36.0)
MCV: 79.9 fL — ABNORMAL LOW (ref 80.0–100.0)
Monocytes Absolute: 0.5 10*3/uL (ref 0.1–1.0)
Monocytes Relative: 6 %
Neutro Abs: 6.1 10*3/uL (ref 1.7–7.7)
Neutrophils Relative %: 72 %
Platelets: 324 10*3/uL (ref 150–400)
RBC: 4.93 MIL/uL (ref 3.87–5.11)
RDW: 13.2 % (ref 11.5–15.5)
WBC: 8.5 10*3/uL (ref 4.0–10.5)
nRBC: 0 % (ref 0.0–0.2)

## 2022-12-16 LAB — COMPREHENSIVE METABOLIC PANEL
AST: 25 U/L (ref 15–41)
Albumin: 4.2 g/dL (ref 3.5–5.0)
Alkaline Phosphatase: 60 U/L (ref 38–126)
Anion gap: 13 (ref 5–15)
Calcium: 9.4 mg/dL (ref 8.9–10.3)
Chloride: 103 mmol/L (ref 98–111)
Creatinine, Ser: 0.52 mg/dL (ref 0.44–1.00)
GFR, Estimated: >60 mL/min (ref >60)
Glucose, Bld: 112 mg/dL — ABNORMAL HIGH (ref 70–99)
Potassium: 4.4 mmol/L (ref 3.5–5.1)
Sodium: 135 mmol/L (ref 135–145)
Total Bilirubin: 0.8 mg/dL (ref 0.3–1.2)
Total Protein: 8.3 g/dL — ABNORMAL HIGH (ref 6.5–8.1)

## 2022-12-16 LAB — HCG, SERUM, QUALITATIVE: Preg, Serum: NEGATIVE

## 2022-12-16 NOTE — ED Provider Notes (Signed)
I assumed care of this patient from previous provider.  Please see their note for further details of history, exam, and MDM.   Briefly patient is a 22 y.o. female who presented here with headache pending reassessment after headache cocktail.  Also pending labs.  With shared decision making, deferred imaging at this time.  CBC without leukocytosis or anemia.  Metabolic panel without significant electrolyte derangements or renal sufficiency.  hCG obtained to rule out pregnancy related headache and to assist in guiding care negative.  After headache cocktail, patient reported complete resolution of her headache.     The patient appears reasonably screened and/or stabilized for discharge and I doubt any other medical condition or other [facility] requiring further screening, evaluation, or treatment in the ED at this time. I have discussed the findings, Dx and Tx plan with the patient/family who expressed understanding and agree(s) with the plan. Discharge instructions discussed at length. The patient/family was given strict return precautions who verbalized understanding of the instructions. No further questions at time of discharge.  Disposition: Discharge  Condition: Good  ED Discharge Orders     None         Follow Up: Georganna Skeans, MD [address] [place] [address] [phone]  Call  to schedule an appointment for close follow up      Shayla Heming, Amadeo Garnet, MD 12/16/22 0222

## 2022-12-21 ENCOUNTER — Ambulatory Visit: Payer: 59 | Admitting: Family Medicine

## 2022-12-22 DIAGNOSIS — M26622 Arthralgia of left temporomandibular joint: Secondary | ICD-10-CM | POA: Diagnosis not present

## 2022-12-22 DIAGNOSIS — H9202 Otalgia, left ear: Secondary | ICD-10-CM | POA: Diagnosis not present

## 2023-01-03 ENCOUNTER — Other Ambulatory Visit: Payer: Self-pay | Admitting: Obstetrics and Gynecology

## 2023-01-03 ENCOUNTER — Other Ambulatory Visit (HOSPITAL_COMMUNITY): Payer: Self-pay

## 2023-01-04 NOTE — Telephone Encounter (Signed)
Medication refill request: ibuprofen 800mg  Last AEX:  none Next AEX: none Last OV: 02-09-22 Last MMG (if hormonal medication request): n/a Refill authorized: please approve or deny as appropriate

## 2023-01-05 ENCOUNTER — Other Ambulatory Visit (HOSPITAL_COMMUNITY): Payer: Self-pay

## 2023-01-11 ENCOUNTER — Other Ambulatory Visit (HOSPITAL_COMMUNITY): Payer: Self-pay

## 2023-01-11 ENCOUNTER — Ambulatory Visit (INDEPENDENT_AMBULATORY_CARE_PROVIDER_SITE_OTHER): Payer: 59 | Admitting: Obstetrics and Gynecology

## 2023-01-11 ENCOUNTER — Encounter: Payer: Self-pay | Admitting: Obstetrics and Gynecology

## 2023-01-11 VITALS — BP 126/82 | Ht 64.0 in | Wt 173.0 lb

## 2023-01-11 DIAGNOSIS — R102 Pelvic and perineal pain unspecified side: Secondary | ICD-10-CM

## 2023-01-11 DIAGNOSIS — N946 Dysmenorrhea, unspecified: Secondary | ICD-10-CM

## 2023-01-11 DIAGNOSIS — L739 Follicular disorder, unspecified: Secondary | ICD-10-CM | POA: Diagnosis not present

## 2023-01-11 MED ORDER — IBUPROFEN 800 MG PO TABS
800.0000 mg | ORAL_TABLET | Freq: Three times a day (TID) | ORAL | 0 refills | Status: DC | PRN
  Filled 2023-01-11: qty 60, 20d supply, fill #0

## 2023-01-11 NOTE — Progress Notes (Unsigned)
GYNECOLOGY  VISIT   HPI: 22 y.o.   Significant Other  African American  female   G0P0000 with Patient's last menstrual period was 01/04/2023 (approximate).   here for   menstrual cramps. Pt has external bumps that leak fluid sometimes.  Does waxing.  Does not shave.  Increases with her menstrual periods.   Still having pelvic cramping.  Occurs a week before the cycle and the first 2 days of her period.  Taking ibuprofen 800 mg every 8 hours.    Not trying for or preventing pregnancy.  Would welcome pregnancy.   Does not want to use birth control.   Pelvic [place] 02/09/22: Uterus 6.72 x 4.47 x 4.89 cm.  2.15 x 1.8 cm subserosal fibroid. EMS 12.90 mm.  No mass or increased blood flow noted.  Left ovary - 3.56 x 2.14 x 2.33 cm.  Right ovary - 4.07 x 3.17 x 2.2 cm.  Resolving corpus luteum cyst 2.9 x 2.0 x 2.6 cm.  Also small avascular complex cyst 0.7 x 0.7 x 0.8 cm.  Normal perfusion to both ovaries.  No adnexal masses.  Small free fluid in cul de sac.   GYNECOLOGIC HISTORY: Patient's last menstrual period was 01/04/2023 (approximate). Contraception:  n/a Menopausal hormone therapy:  n/a Last mammogram:  n/a Last pap smear:   02/05/20 neg        OB History     Gravida  0   Para  0   Term  0   Preterm  0   AB  0   Living  0      SAB  0   IAB  0   Ectopic  0   Multiple  0   Live Births  0              Patient Active Problem List   Diagnosis Date Noted   Arthralgia of left temporomandibular joint 06/03/2021   Otalgia of left ear 06/03/2021   Folliculitis 02/05/2020   Dysmenorrhea 02/05/2020    Past Medical History:  Diagnosis Date   Abnormal uterine bleeding    Dysmenorrhea    GERD (gastroesophageal reflux disease)     Past Surgical History:  Procedure Laterality Date   WISDOM TOOTH EXTRACTION  10/22/2022    Current Outpatient Medications  Medication Sig Dispense Refill   Brimonidine Tartrate (LUMIFY OP) Place 1 drop into both eyes daily.      clobetasol (TEMOVATE) 0.05 % external solution Apply to affected areas twice daily as directed for 4-6 weeks 50 mL 1   cycloSPORINE (RESTASIS) 0.05 % ophthalmic emulsion Instill 1 drop into both eyes twice a day 180 each 3   fluorometholone (FML) 0.1 % ophthalmic suspension Instill 1 drop into both eyes as directed: 1 drop 4 times a day for 2 weeks then 1 drop 2 times a day for 2 weeks 10 mL 0   ibuprofen (ADVIL) 800 MG tablet Take 1 tablet (800 mg total) by mouth every 8 (eight) hours as needed. 60 tablet 0   ketorolac (TORADOL) 10 MG tablet Take 1 tablet (10 mg) by mouth every 6 hours as needed (pain). 20 tablet 0   Multiple Vitamin (MULTIVITAMIN PO) Take 2 tablets by mouth daily as needed (When Pt remembers).     neomycin-polymyxin-hydrocortisone (CORTISPORIN) 3.5-10000-1 OTIC suspension Place 4 drops into the left ear daily. Take for 7 days. 10 mL 0   OVER THE COUNTER MEDICATION Take 1 tablet by mouth daily as needed (When Pt remembers). Apple  Cider Vinegar Gummies     polyethylene glycol powder (GLYCOLAX/MIRALAX) 17 GM/SCOOP powder Take 255 g by mouth daily for 2 days as directed.  255 grams = 1 bottle (238g) + 1 capful from other bottle. 714 g 3   tretinoin (RETIN-A) 0.05 % cream Apply a pea size amount to the entire face every other night then increase to nightly as tolerated. 45 g 8   No current facility-administered medications for this visit.     ALLERGIES: Patient has no known allergies.  History reviewed. No pertinent family history.  Social History   Socioeconomic History   Marital status: Significant Other    Spouse name: Not on file   Number of children: 0   Years of education: Not on file   Highest education level: Associate degree: occupational, Scientist, product/process development, or vocational program  Occupational History   Occupation: scheduling  Tobacco Use   Smoking status: Never   Smokeless tobacco: Never  Vaping Use   Vaping status: Never Used  Substance and Sexual Activity    Alcohol use: Yes    Alcohol/week: 1.0 standard drink of alcohol    Types: 1 Glasses of wine per week    Comment: socially--2drinks/mo   Drug use: Never   Sexual activity: Yes    Birth control/protection: None  Other Topics Concern   Not on file  Social History Narrative   Not on file   Social Determinants of Health   Financial Resource Strain: Medium Risk (12/14/2022)   Overall Financial Resource Strain (CARDIA)    Difficulty of Paying Living Expenses: Somewhat hard  Food Insecurity: Food Insecurity Present (12/14/2022)   Hunger Vital Sign    Worried About Running Out of Food in the Last Year: Sometimes true    Ran Out of Food in the Last Year: Sometimes true  Transportation Needs: No Transportation Needs (12/14/2022)   PRAPARE - Administrator, Civil Service (Medical): No    Lack of Transportation (Non-Medical): No  Physical Activity: Unknown (12/14/2022)   Exercise Vital Sign    Days of Exercise per Week: 0 days    Minutes of Exercise per Session: Not on file  Stress: Stress Concern Present (12/14/2022)   [employer] of Occupational Health - Occupational Stress Questionnaire    Feeling of Stress : To some extent  Social Connections: Unknown (12/14/2022)   Social Connection and Isolation Panel [NHANES]    Frequency of Communication with Friends and Family: Three times a week    Frequency of Social Gatherings with Friends and Family: Once a week    Attends Religious Services: Patient declined    Database administrator or Organizations: Patient declined    Attends Banker Meetings: Not on file    Marital Status: Never married  Intimate Partner Violence: Unknown (08/27/2021)   Received from [employer], [facility]   HITS    Physically Hurt: Not on file    Insult or Talk Down To: Not on file    Threaten Physical Harm: Not on file    Scream or Curse: Not on file    Review of Systems  All other systems reviewed and are negative.   PHYSICAL  EXAMINATION:    BP 126/82 (BP Location: Right Arm, Patient Position: Sitting, Cuff Size: Normal)   Ht 5\' 4"  (1.626 m)   Wt 173 lb (78.5 kg)   LMP 01/04/2023 (Approximate)   BMI 29.70 kg/m     General appearance: alert, cooperative and appears stated age  Pelvic: External genitalia:  short hair, raised hair follicles, slightly tender.  No abscesses.              Urethra:  normal appearing urethra with no masses, tenderness or lesions              Bartholins and Skenes: normal                 Vagina: normal appearing vagina with normal color and discharge, no lesions              Cervix: no lesions                Bimanual Exam:  Uterus:  normal size, contour, position, consistency, mobility, non-tender              Adnexa: no mass, fullness, tenderness         Chaperone was present for exam:  Gwenith Spitz, CMA  ASSESSMENT  Pelvic cramping.  Dysmenorrhea.  Vulvar follicle irritation.  No frank infection.   PLAN  Stop waxing.  Neopsorin.  Warm soaks.  Motrin 800 mg every 8 hours as needed for pelvic cramping.  #60, RF none.  Prenatal vitamin recommended.  Return for annual exam and pap.

## 2023-01-12 ENCOUNTER — Other Ambulatory Visit (HOSPITAL_COMMUNITY): Payer: Self-pay

## 2023-02-09 ENCOUNTER — Encounter: Payer: 59 | Admitting: Family Medicine

## 2023-03-10 ENCOUNTER — Other Ambulatory Visit (HOSPITAL_BASED_OUTPATIENT_CLINIC_OR_DEPARTMENT_OTHER): Payer: Self-pay | Admitting: Physician Assistant

## 2023-03-10 DIAGNOSIS — H9202 Otalgia, left ear: Secondary | ICD-10-CM

## 2023-03-10 DIAGNOSIS — M26622 Arthralgia of left temporomandibular joint: Secondary | ICD-10-CM

## 2023-03-18 ENCOUNTER — Ambulatory Visit (HOSPITAL_COMMUNITY)
Admission: RE | Admit: 2023-03-18 | Discharge: 2023-03-18 | Disposition: A | Discharge status: Home / Self Care | Payer: 59 | Source: Ambulatory Visit | Attending: Physician Assistant

## 2023-03-18 DIAGNOSIS — H9202 Otalgia, left ear: Secondary | ICD-10-CM | POA: Diagnosis not present

## 2023-03-18 DIAGNOSIS — R590 Localized enlarged lymph nodes: Secondary | ICD-10-CM | POA: Diagnosis not present

## 2023-03-18 DIAGNOSIS — M26622 Arthralgia of left temporomandibular joint: Secondary | ICD-10-CM | POA: Insufficient documentation

## 2023-03-18 MED ORDER — IOHEXOL 300 MG/ML  SOLN
75.0000 mL | Freq: Once | INTRAMUSCULAR | Status: AC | PRN
  Administered 2023-03-18: 75 mL via INTRAVENOUS

## 2023-04-12 ENCOUNTER — Other Ambulatory Visit (HOSPITAL_COMMUNITY): Payer: Self-pay

## 2023-04-18 ENCOUNTER — Other Ambulatory Visit (HOSPITAL_COMMUNITY): Payer: Self-pay

## 2023-04-18 MED ORDER — AMOXICILLIN-POT CLAVULANATE 875-125 MG PO TABS
1.0000 | ORAL_TABLET | Freq: Two times a day (BID) | ORAL | 0 refills | Status: DC
  Filled 2023-04-18: qty 20, 10d supply, fill #0

## 2023-04-19 ENCOUNTER — Other Ambulatory Visit (HOSPITAL_COMMUNITY): Payer: Self-pay

## 2023-04-23 ENCOUNTER — Other Ambulatory Visit (HOSPITAL_COMMUNITY): Payer: Self-pay

## 2023-06-08 NOTE — Progress Notes (Unsigned)
23 y.o. G0P0000 Significant Other African American female here for annual exam    Still having period cramping.  Needs refill of Motrin 800 mg, which helps.  On her menstrual cycle today. Not pursuing other treatment options.  Pelvic US 02/09/22 showed small subserosal fibroid 2.15 x 1.8 cm.   No partner change.   Had some vaginal itching, which resolved.    Asking about boric acid suppositories for vaginal health.   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  Has a 57 month old nephew.   PCP: Georganna Skeans, MD   Patient's last menstrual period was 06/19/2023.     Period Cycle (Days): 28 Period Duration (Days): 4-5 Period Pattern: Regular Menstrual Flow: Heavy Menstrual Control: Maxi pad, Tampon Dysmenorrhea: (!) Moderate     Sexually active: Yes.    The current method of family planning is condoms.    Menopausal hormone therapy:  n/a Exercising: No.   Smoker:  no  OB History  Gravida Para Term Preterm AB Living  0 0 0 0 0 0  SAB IAB Ectopic Multiple Live Births  0 0 0 0 0     HEALTH MAINTENANCE: Last 2 paps:  02/05/20 neg History of abnormal Pap or positive HPV:  no Mammogram:   n/a Colonoscopy:  n/a Bone Density: n/a  Result  n/a   Immunization History  Administered Date(s) Administered   HPV 9-valent 01/13/2017   HPV Quadrivalent 06/19/2015, 10/15/2015   Hepatitis A, Ped/Adol-2 Dose 06/19/2015, 01/13/2017   Hepatitis B, PED/ADOLESCENT 12/09/2014, 06/19/2015, 10/15/2015   IPV 12/09/2014, 06/19/2015   Influenza,inj,Quad PF,6-35 Mos 02/15/2022   Influenza,trivalent, recombinat, inj, PF 06/19/2015   MMR 12/09/2014, 06/19/2015   Meningococcal Acwy, Unspecified 12/09/2014, 06/19/2015   Meningococcal B, OMV 06/19/2015, 10/15/2015   Td (Adult), 2 Lf Tetanus Toxid, Preservative Free 12/09/2014, 06/19/2015   Tdap 12/09/2014   Varicella 06/19/2015, 10/15/2015   Yellow Fever 08/21/2014      reports that she has never smoked. She has never used smokeless tobacco.  She reports current alcohol use of about 1.0 standard drink of alcohol per week. She reports that she does not use drugs.  Past Medical History:  Diagnosis Date   Abnormal uterine bleeding    Dysmenorrhea    GERD (gastroesophageal reflux disease)     Past Surgical History:  Procedure Laterality Date   WISDOM TOOTH EXTRACTION  10/22/2022    Current Outpatient Medications  Medication Sig Dispense Refill   Brimonidine Tartrate (LUMIFY OP) Place 1 drop into both eyes daily.     clobetasol (TEMOVATE) 0.05 % external solution Apply to affected areas twice daily as directed for 4-6 weeks 50 mL 1   cycloSPORINE (RESTASIS) 0.05 % ophthalmic emulsion Instill 1 drop into both eyes twice a day 180 each 3   fluorometholone (FML) 0.1 % ophthalmic suspension Instill 1 drop into both eyes as directed: 1 drop 4 times a day for 2 weeks then 1 drop 2 times a day for 2 weeks 10 mL 0   ibuprofen (ADVIL) 800 MG tablet Take 1 tablet (800 mg total) by mouth every 8 (eight) hours as needed. 60 tablet 0   ketorolac (TORADOL) 10 MG tablet Take 1 tablet (10 mg) by mouth every 6 hours as needed (pain). 20 tablet 0   Multiple Vitamin (MULTIVITAMIN PO) Take 2 tablets by mouth daily as needed (When Pt remembers).     neomycin-polymyxin-hydrocortisone (CORTISPORIN) 3.5-10000-1 OTIC suspension Place 4 drops into the left ear daily. Take for 7 days. 10 mL 0  OVER THE COUNTER MEDICATION Take 1 tablet by mouth daily as needed (When Pt remembers). Apple Cider Vinegar Gummies     polyethylene glycol powder (GLYCOLAX/MIRALAX) 17 GM/SCOOP powder Take 255 g by mouth daily for 2 days as directed.  255 grams = 1 bottle (238g) + 1 capful from other bottle. 714 g 3   tretinoin (RETIN-A) 0.05 % cream Apply a pea size amount to the entire face every other night then increase to nightly as tolerated. 45 g 8   No current facility-administered medications for this visit.    ALLERGIES: Patient has no known allergies.  History  reviewed. No pertinent family history.  Review of Systems  All other systems reviewed and are negative.   PHYSICAL EXAM:  BP 128/84 (BP Location: Right Arm, Patient Position: Sitting, Cuff Size: Small)   Pulse 92   Ht 5' 5.5" (1.664 m)   Wt 177 lb (80.3 kg)   LMP 06/19/2023   SpO2 97%   BMI 29.01 kg/m     General appearance: alert, cooperative and appears stated age Head: normocephalic, without obvious abnormality, atraumatic Neck: no adenopathy, supple, symmetrical, trachea midline and thyroid normal to inspection and palpation Lungs: clear to auscultation bilaterally Breasts: normal appearance, no masses or tenderness, No nipple retraction or dimpling, No nipple discharge or bleeding, No axillary adenopathy Heart: regular rate and rhythm Abdomen: soft, non-tender; no masses, no organomegaly Extremities: extremities normal, atraumatic, no cyanosis or edema Skin: skin color, texture, turgor normal. No rashes or lesions Lymph nodes: cervical, supraclavicular, and axillary nodes normal. Neurologic: grossly normal  Pelvic: External genitalia:  no lesions              No abnormal inguinal nodes palpated.              Urethra:  normal appearing urethra with no masses, tenderness or lesions              Bartholins and Skenes: normal                 Vagina: normal appearing vagina with normal color and discharge, no lesions              Cervix: no lesions.  Menstrual flow, not heavy.              Pap taken: Yes.   Bimanual Exam:  Uterus:  normal size, contour, position, consistency, mobility, non-tender              Adnexa: no mass, fullness, tenderness          Chaperone was present for exam:  Warren Lacy, CMA  ASSESSMENT: Well woman visit with gynecologic exam Dysmenorrhea. Cervical cancer screening.  STD screening.  Routine labs. PHQ2:  1.    PLAN: Mammogram screening discussed. Self breast awareness reviewed. Pap and reflex HRV collected:  Yes.   Guidelines for Calcium,  Vitamin D, regular exercise program including cardiovascular and weight bearing exercise. Medication refills:  Motrin 800 mg po q 8 hours prn.  Start PNV if not using pregnancy prevention consistently.  STD screening and routine labs.  Boric acid is not recommended as a routine part of vaginal health care.  I recommended an office visit for symptoms of vaginal discharge, burning or odor.  Follow up:  1 year and prn.

## 2023-06-21 ENCOUNTER — Encounter: Payer: Self-pay | Admitting: Obstetrics and Gynecology

## 2023-06-21 NOTE — Telephone Encounter (Signed)
Per CS: "Spoke with patient she will keep appointment."  Encounter closed.

## 2023-06-22 ENCOUNTER — Other Ambulatory Visit (HOSPITAL_COMMUNITY)
Admission: RE | Admit: 2023-06-22 | Discharge: 2023-06-22 | Disposition: A | Discharge status: Home / Self Care | Payer: Self-pay | Source: Ambulatory Visit | Attending: Obstetrics and Gynecology | Admitting: Obstetrics and Gynecology

## 2023-06-22 ENCOUNTER — Encounter: Payer: Self-pay | Admitting: Obstetrics and Gynecology

## 2023-06-22 ENCOUNTER — Ambulatory Visit: Payer: 59 | Admitting: Obstetrics and Gynecology

## 2023-06-22 VITALS — BP 128/84 | HR 92 | Ht 65.5 in | Wt 177.0 lb

## 2023-06-22 DIAGNOSIS — Z113 Encounter for screening for infections with a predominantly sexual mode of transmission: Secondary | ICD-10-CM | POA: Insufficient documentation

## 2023-06-22 DIAGNOSIS — Z0000 Encounter for general adult medical examination without abnormal findings: Secondary | ICD-10-CM | POA: Diagnosis not present

## 2023-06-22 DIAGNOSIS — Z124 Encounter for screening for malignant neoplasm of cervix: Secondary | ICD-10-CM

## 2023-06-22 DIAGNOSIS — Z1159 Encounter for screening for other viral diseases: Secondary | ICD-10-CM | POA: Diagnosis not present

## 2023-06-22 DIAGNOSIS — Z1331 Encounter for screening for depression: Secondary | ICD-10-CM | POA: Diagnosis not present

## 2023-06-22 DIAGNOSIS — N946 Dysmenorrhea, unspecified: Secondary | ICD-10-CM | POA: Diagnosis not present

## 2023-06-22 DIAGNOSIS — Z114 Encounter for screening for human immunodeficiency virus [HIV]: Secondary | ICD-10-CM

## 2023-06-22 DIAGNOSIS — Z01419 Encounter for gynecological examination (general) (routine) without abnormal findings: Secondary | ICD-10-CM | POA: Diagnosis not present

## 2023-06-22 NOTE — Patient Instructions (Signed)

## 2023-06-23 LAB — COMPREHENSIVE METABOLIC PANEL
AG Ratio: 1.6 (calc) (ref 1.0–2.5)
ALT: 13 U/L (ref 6–29)
AST: 17 U/L (ref 10–30)
Albumin: 4.7 g/dL (ref 3.6–5.1)
Alkaline phosphatase (APISO): 67 U/L (ref 31–125)
BUN: 8 mg/dL (ref 7–25)
CO2: 20 mmol/L (ref 20–32)
Calcium: 9.6 mg/dL (ref 8.6–10.2)
Chloride: 104 mmol/L (ref 98–110)
Creat: 0.66 mg/dL (ref 0.50–0.96)
Globulin: 3 g/dL (ref 1.9–3.7)
Glucose, Bld: 77 mg/dL (ref 65–99)
Potassium: 4 mmol/L (ref 3.5–5.3)
Sodium: 138 mmol/L (ref 135–146)
Total Bilirubin: 0.7 mg/dL (ref 0.2–1.2)
Total Protein: 7.7 g/dL (ref 6.1–8.1)

## 2023-06-23 LAB — CBC
HCT: 39.7 % (ref 35.0–45.0)
Hemoglobin: 12.4 g/dL (ref 11.7–15.5)
MCH: 25.1 pg — ABNORMAL LOW (ref 27.0–33.0)
MCHC: 31.2 g/dL — ABNORMAL LOW (ref 32.0–36.0)
MCV: 80.4 fL (ref 80.0–100.0)
MPV: 12.3 fL (ref 7.5–12.5)
Platelets: 304 10*3/uL (ref 140–400)
RBC: 4.94 10*6/uL (ref 3.80–5.10)
RDW: 12.5 % (ref 11.0–15.0)
WBC: 6 10*3/uL (ref 3.8–10.8)

## 2023-06-23 LAB — LIPID PANEL
Cholesterol: 146 mg/dL (ref <200)
HDL: 55 mg/dL (ref >=50)
LDL Cholesterol (Calc): 70 mg/dL
Non-HDL Cholesterol (Calc): 91 mg/dL (ref <130)
Total CHOL/HDL Ratio: 2.7 (calc) (ref <5.0)
Triglycerides: 124 mg/dL (ref <150)

## 2023-06-23 LAB — RPR: RPR Ser Ql: NONREACTIVE

## 2023-06-23 LAB — HIV ANTIBODY (ROUTINE TESTING W REFLEX): HIV 1&2 Ab, 4th Generation: NONREACTIVE

## 2023-06-23 LAB — HEPATITIS C ANTIBODY: Hepatitis C Ab: NONREACTIVE

## 2023-06-23 MED ORDER — IBUPROFEN 800 MG PO TABS
800.0000 mg | ORAL_TABLET | Freq: Three times a day (TID) | ORAL | 1 refills | Status: DC | PRN
  Filled 2023-06-23: qty 60, 20d supply, fill #0
  Filled 2023-10-13: qty 60, 20d supply, fill #1

## 2023-06-24 ENCOUNTER — Other Ambulatory Visit (HOSPITAL_COMMUNITY): Payer: Self-pay

## 2023-06-24 ENCOUNTER — Other Ambulatory Visit: Payer: Self-pay

## 2023-06-24 ENCOUNTER — Encounter: Payer: Self-pay | Admitting: Obstetrics and Gynecology

## 2023-06-24 LAB — CYTOLOGY - PAP
Adequacy: ABNORMAL
Chlamydia: NEGATIVE
Comment: NEGATIVE
Comment: NEGATIVE
Comment: NORMAL
Neisseria Gonorrhea: NEGATIVE
Trichomonas: NEGATIVE

## 2023-06-28 ENCOUNTER — Other Ambulatory Visit (HOSPITAL_COMMUNITY): Payer: Self-pay

## 2023-07-04 NOTE — Progress Notes (Signed)
GYNECOLOGY  VISIT   HPI: 23 y.o.   Significant Other  African American female   [id number] with Patient's last menstrual period was 06/19/2023.   here for: repeat pap. Still feeling slightly itching.   Patient's pap was nondiagnostic 06/22/23.   She had light bleeding on the day of that pap smear.  Having itching along the labia and the vaginal opening.  It comes and goes.  A little discharge.  Notes a different odor.   No vaginal bleeding today.   GYNECOLOGIC HISTORY: Patient's last menstrual period was 06/19/2023. Contraception:  condoms Menopausal hormone therapy:  n/a Last 2 paps:  02/05/20 neg History of abnormal Pap or positive HPV:  no Mammogram:  n/a        OB History     Gravida  0   Para  0   Term  0   Preterm  0   AB  0   Living  0      SAB  0   IAB  0   Ectopic  0   Multiple  0   Live Births  0              Patient Active Problem List   Diagnosis Date Noted   Arthralgia of left temporomandibular joint 06/03/2021   Otalgia of left ear 06/03/2021   Folliculitis 02/05/2020   Dysmenorrhea 02/05/2020    Past Medical History:  Diagnosis Date   Abnormal uterine bleeding    Dysmenorrhea    GERD (gastroesophageal reflux disease)     Past Surgical History:  Procedure Laterality Date   WISDOM TOOTH EXTRACTION  10/22/2022    Current Outpatient Medications  Medication Sig Dispense Refill   Brimonidine Tartrate (LUMIFY OP) Place 1 drop into both eyes daily.     clobetasol (TEMOVATE) 0.05 % external solution Apply to affected areas twice daily as directed for 4-6 weeks 50 mL 1   cycloSPORINE (RESTASIS) 0.05 % ophthalmic emulsion Instill 1 drop into both eyes twice a day 180 each 3   fluorometholone (FML) 0.1 % ophthalmic suspension Instill 1 drop into both eyes as directed: 1 drop 4 times a day for 2 weeks then 1 drop 2 times a day for 2 weeks 10 mL 0   ibuprofen (ADVIL) 800 MG tablet Take 1 tablet (800 mg total) by mouth every 8 (eight)  hours as needed. 60 tablet 1   ketorolac (TORADOL) 10 MG tablet Take 1 tablet (10 mg) by mouth every 6 hours as needed (pain). 20 tablet 0   Multiple Vitamin (MULTIVITAMIN PO) Take 2 tablets by mouth daily as needed (When Pt remembers).     neomycin-polymyxin-hydrocortisone (CORTISPORIN) 3.5-10000-1 OTIC suspension Place 4 drops into the left ear daily. Take for 7 days. 10 mL 0   OVER THE COUNTER MEDICATION Take 1 tablet by mouth daily as needed (When Pt remembers). Apple Cider Vinegar Gummies     polyethylene glycol powder (GLYCOLAX/MIRALAX) 17 GM/SCOOP powder Take 255 g by mouth daily for 2 days as directed.  255 grams = 1 bottle (238g) + 1 capful from other bottle. 714 g 3   tretinoin (RETIN-A) 0.05 % cream Apply a pea size amount to the entire face every other night then increase to nightly as tolerated. 45 g 8   No current facility-administered medications for this visit.     ALLERGIES: Patient has no known allergies.  No family history on file.  Social History   Socioeconomic History   Marital status: Significant  Other    Spouse name: Not on file   Number of children: 0   Years of education: Not on file   Highest education level: Associate degree: occupational, Scientist, product/process development, or vocational program  Occupational History   Occupation: scheduling  Tobacco Use   Smoking status: Never   Smokeless tobacco: Never  Vaping Use   Vaping status: Never Used  Substance and Sexual Activity   Alcohol use: Yes    Alcohol/week: 1.0 standard drink of alcohol    Types: 1 Glasses of wine per week    Comment: socially--2drinks/mo   Drug use: Never   Sexual activity: Yes    Birth control/protection: None  Other Topics Concern   Not on file  Social History Narrative   Not on file   Social Drivers of Health   Financial Resource Strain: Medium Risk (12/14/2022)   Overall Financial Resource Strain (CARDIA)    Difficulty of Paying Living Expenses: Somewhat hard  Food Insecurity: Food Insecurity  Present (12/14/2022)   Hunger Vital Sign    Worried About Running Out of Food in the Last Year: Sometimes true    Ran Out of Food in the Last Year: Sometimes true  Transportation Needs: No Transportation Needs (12/14/2022)   PRAPARE - Administrator, Civil Service (Medical): No    Lack of Transportation (Non-Medical): No  Physical Activity: Unknown (12/14/2022)   Exercise Vital Sign    Days of Exercise per Week: 0 days    Minutes of Exercise per Session: Not on file  Stress: Stress Concern Present (12/14/2022)   [employer] of Occupational Health - Occupational Stress Questionnaire    Feeling of Stress : To some extent  Social Connections: Unknown (12/14/2022)   Social Connection and Isolation Panel [NHANES]    Frequency of Communication with Friends and Family: Three times a week    Frequency of Social Gatherings with Friends and Family: Once a week    Attends Religious Services: Patient declined    Database administrator or Organizations: Patient declined    Attends Banker Meetings: Not on file    Marital Status: Never married  Intimate Partner Violence: Unknown (08/27/2021)   Received from [employer], [facility]   HITS    Physically Hurt: Not on file    Insult or Talk Down To: Not on file    Threaten Physical Harm: Not on file    Scream or Curse: Not on file    Review of Systems  All other systems reviewed and are negative.   PHYSICAL EXAMINATION:   BP 124/82 (BP Location: Right Arm, Patient Position: Sitting, Cuff Size: Small)   Pulse 74   Ht 5' 5.5" (1.664 m)   Wt 177 lb (80.3 kg)   LMP 06/19/2023   SpO2 99%   BMI 29.01 kg/m     General appearance: alert, cooperative and appears stated age   Pelvic: External genitalia:  no lesions              Urethra:  normal appearing urethra with no masses, tenderness or lesions              Bartholins and Skenes: normal                 Vagina: normal appearing vagina with normal color and small  amounts of white cur like discharge, no lesions              Cervix: no lesions  Bimanual Exam:  Uterus:  normal size, contour, position, consistency, mobility, non-tender              Adnexa: no mass, fullness, tenderness      Chaperone was present for exam:  Warren Lacy, CMA  ASSESSMENT:  Vulvovaginitis. BV.  Hemoglobinopathy.    PLAN:  Wet prep:  clue cells present, neg yeast, neg trich  Flagyl 500 mg po bid x 7 days. ETOH precautions given. Pap and reflex HR HPV testing.  Referral for patient to see her PCP regarding her hemoglobinopathy.  She may need a hemoglobin electrophoresis.   20 min  total time was spent for this patient encounter, including preparation, face-to-face counseling with the patient, coordination of care, and documentation of the encounter.

## 2023-07-05 ENCOUNTER — Encounter: Payer: Self-pay | Admitting: Obstetrics and Gynecology

## 2023-07-05 ENCOUNTER — Other Ambulatory Visit (HOSPITAL_COMMUNITY): Payer: Self-pay

## 2023-07-05 ENCOUNTER — Ambulatory Visit (INDEPENDENT_AMBULATORY_CARE_PROVIDER_SITE_OTHER): Payer: 59 | Admitting: Obstetrics and Gynecology

## 2023-07-05 ENCOUNTER — Other Ambulatory Visit (HOSPITAL_COMMUNITY)
Admission: RE | Admit: 2023-07-05 | Discharge: 2023-07-05 | Disposition: A | Discharge status: Home / Self Care | Payer: 59 | Source: Ambulatory Visit | Attending: Obstetrics and Gynecology | Admitting: Obstetrics and Gynecology

## 2023-07-05 VITALS — BP 124/82 | HR 74 | Ht 65.5 in | Wt 177.0 lb

## 2023-07-05 DIAGNOSIS — D582 Other hemoglobinopathies: Secondary | ICD-10-CM

## 2023-07-05 DIAGNOSIS — R87615 Unsatisfactory cytologic smear of cervix: Secondary | ICD-10-CM

## 2023-07-05 DIAGNOSIS — Z124 Encounter for screening for malignant neoplasm of cervix: Secondary | ICD-10-CM | POA: Diagnosis not present

## 2023-07-05 DIAGNOSIS — N760 Acute vaginitis: Secondary | ICD-10-CM | POA: Diagnosis not present

## 2023-07-05 DIAGNOSIS — B9689 Other specified bacterial agents as the cause of diseases classified elsewhere: Secondary | ICD-10-CM

## 2023-07-05 LAB — WET PREP FOR TRICH, YEAST, CLUE

## 2023-07-05 MED ORDER — METRONIDAZOLE 500 MG PO TABS
500.0000 mg | ORAL_TABLET | Freq: Two times a day (BID) | ORAL | 0 refills | Status: AC
  Filled 2023-07-05: qty 14, 7d supply, fill #0

## 2023-07-08 LAB — CYTOLOGY - PAP: Diagnosis: NEGATIVE

## 2023-07-09 ENCOUNTER — Other Ambulatory Visit (HOSPITAL_COMMUNITY): Payer: Self-pay

## 2023-07-09 ENCOUNTER — Encounter: Payer: Self-pay | Admitting: Obstetrics and Gynecology

## 2023-07-11 ENCOUNTER — Other Ambulatory Visit (HOSPITAL_COMMUNITY): Payer: Self-pay

## 2023-07-11 ENCOUNTER — Other Ambulatory Visit: Payer: Self-pay | Admitting: Obstetrics and Gynecology

## 2023-07-11 MED ORDER — FLUCONAZOLE 150 MG PO TABS
150.0000 mg | ORAL_TABLET | Freq: Once | ORAL | 0 refills | Status: AC
  Filled 2023-07-11: qty 2, 2d supply, fill #0

## 2023-07-11 NOTE — Progress Notes (Signed)
 Rx for Diflucan

## 2023-12-06 ENCOUNTER — Ambulatory Visit (HOSPITAL_COMMUNITY)
Admission: RE | Admit: 2023-12-06 | Discharge: 2023-12-06 | Disposition: A | Discharge status: Home / Self Care | Payer: Self-pay | Source: Ambulatory Visit | Attending: Nurse Practitioner | Admitting: Nurse Practitioner

## 2023-12-06 ENCOUNTER — Other Ambulatory Visit (HOSPITAL_COMMUNITY): Payer: Self-pay | Admitting: Nurse Practitioner

## 2023-12-06 DIAGNOSIS — Z026 Encounter for examination for insurance purposes: Secondary | ICD-10-CM

## 2023-12-06 DIAGNOSIS — S8002XA Contusion of left knee, initial encounter: Secondary | ICD-10-CM | POA: Insufficient documentation

## 2023-12-06 DIAGNOSIS — S8992XA Unspecified injury of left lower leg, initial encounter: Secondary | ICD-10-CM | POA: Diagnosis not present

## 2023-12-14 IMAGING — US US ABDOMEN LIMITED
1 series · 14 of 25 positions shown · non-contrast
Comparison: None.

CLINICAL DATA: Right upper quadrant pain.

EXAM:
ULTRASOUND ABDOMEN LIMITED RIGHT UPPER QUADRANT

[Series 1: us abdomen limited ruq (liver/gb) · 14 of 44 slices shown]
[im 1/44]
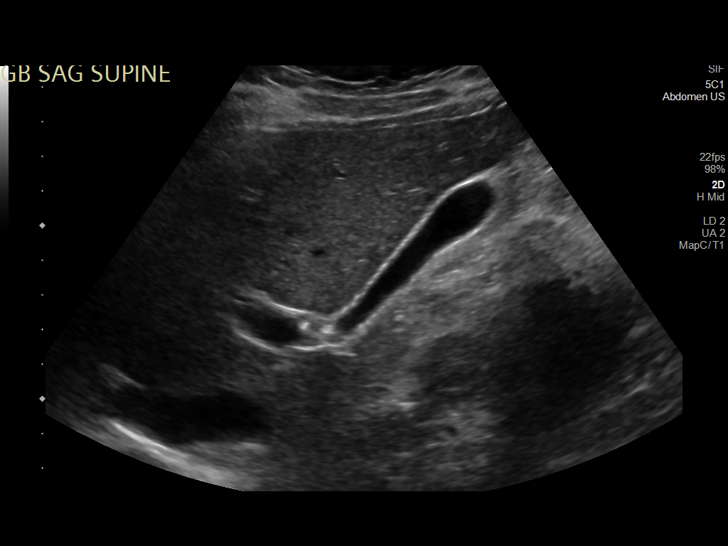
[im 4/44]
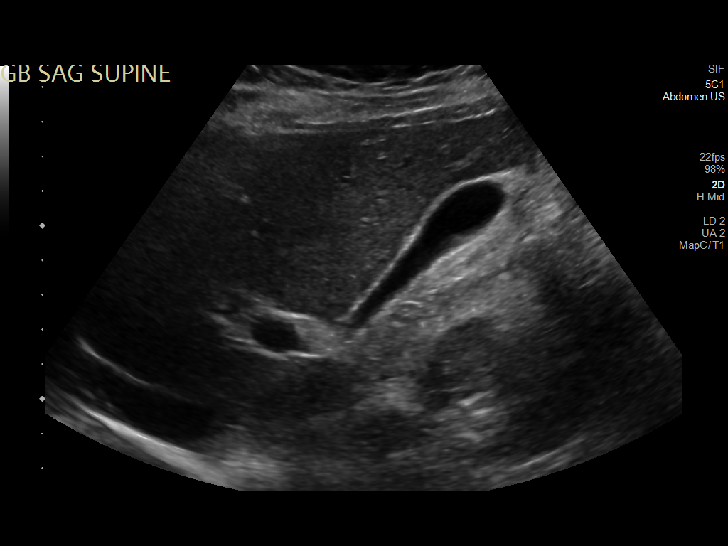
[im 8/44]
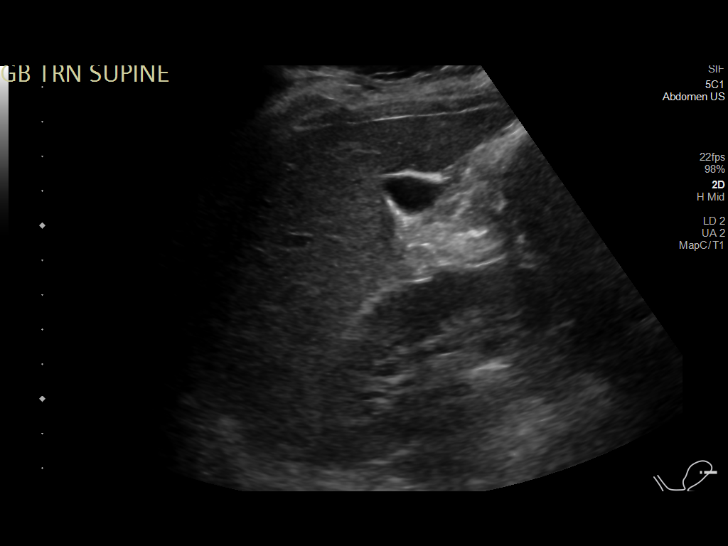
[im 11/44]
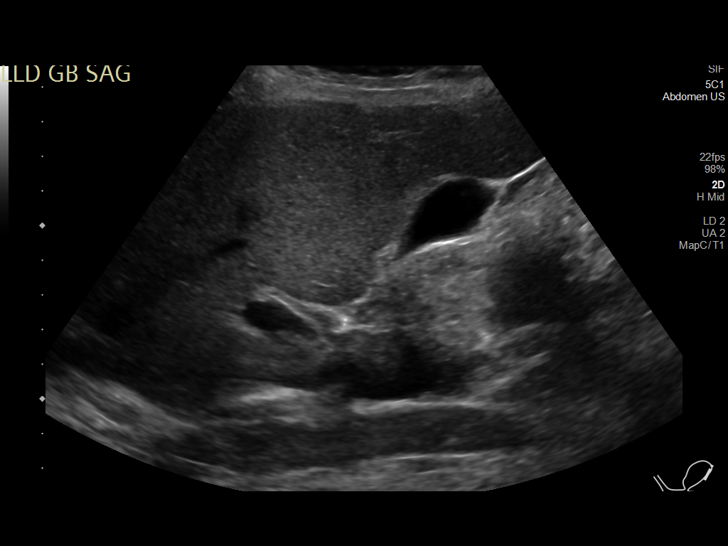
[im 15/44]
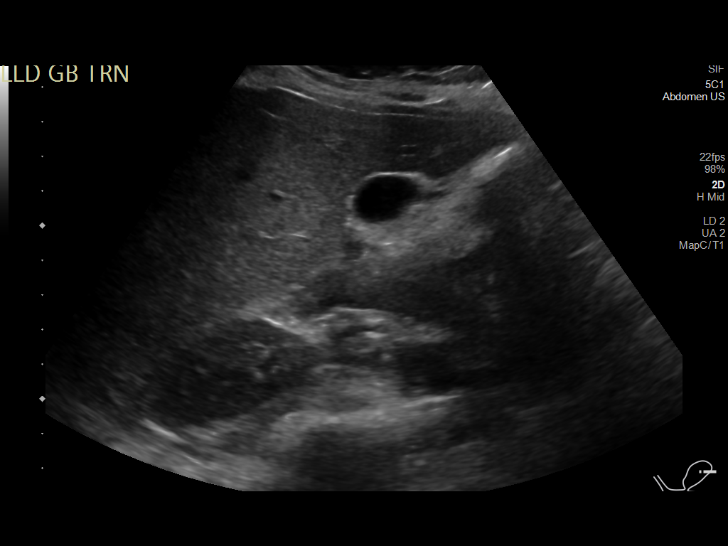
[im 17/44]
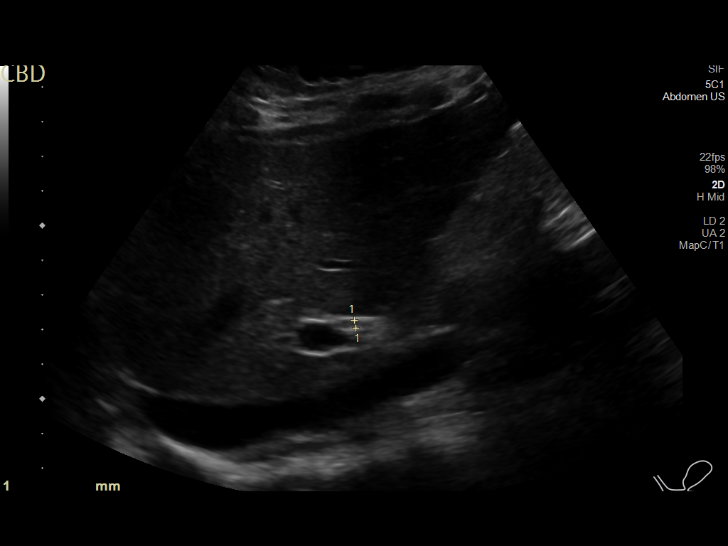
[im 20/44]
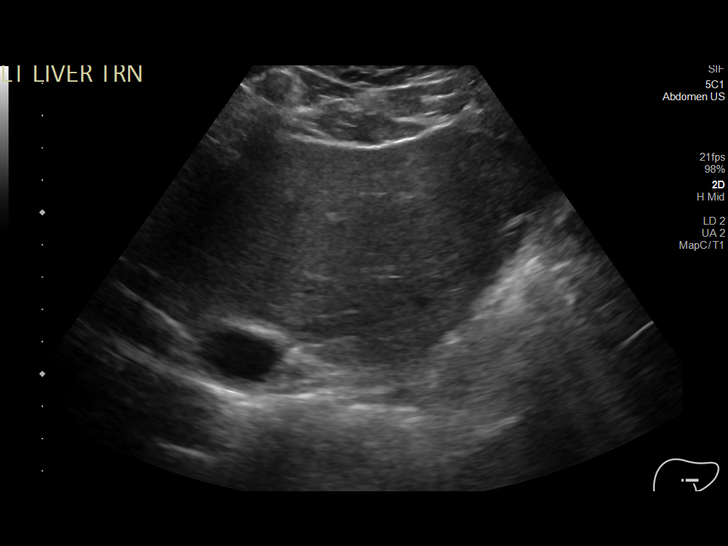
[im 24/44]
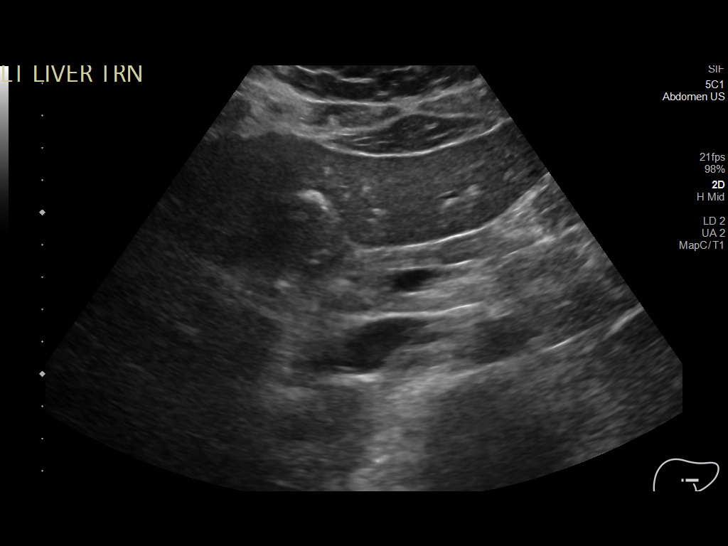
[im 27/44]
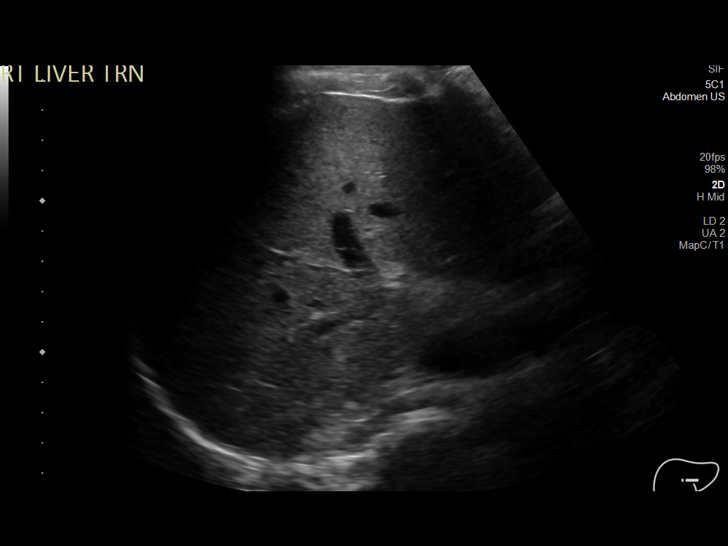
[im 29/44]
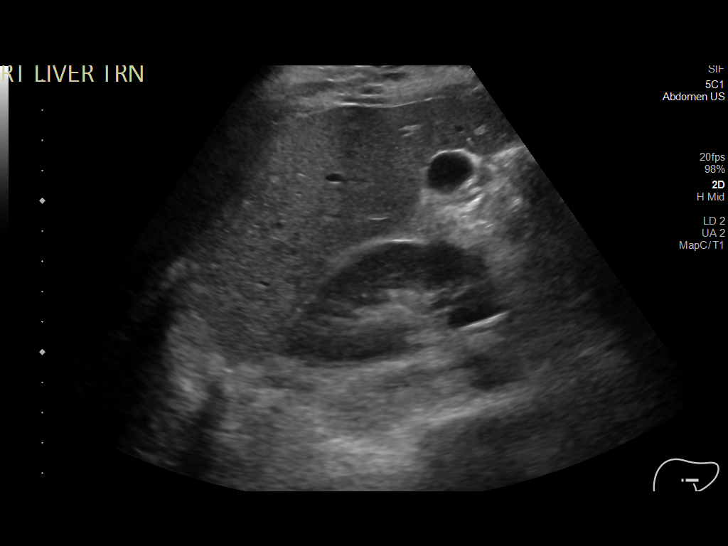
[im 33/44]
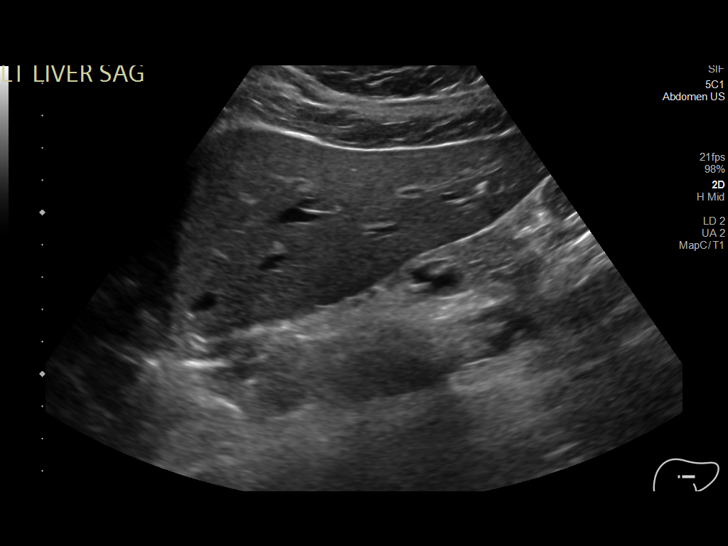
[im 36/44]
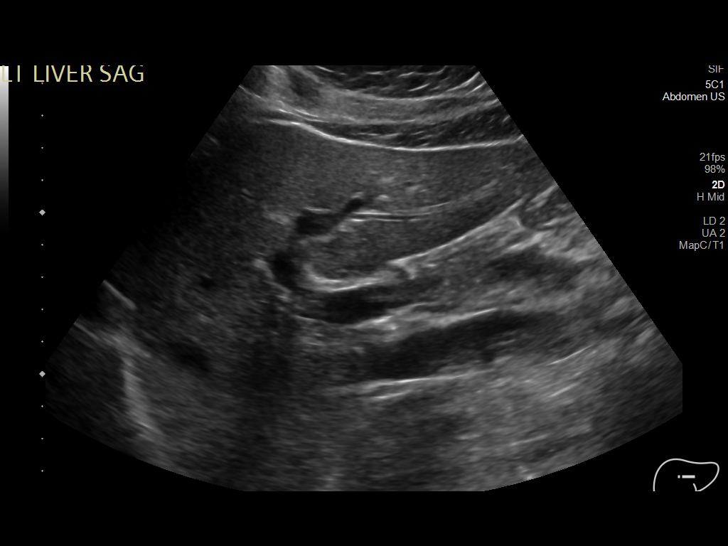
[im 40/44]
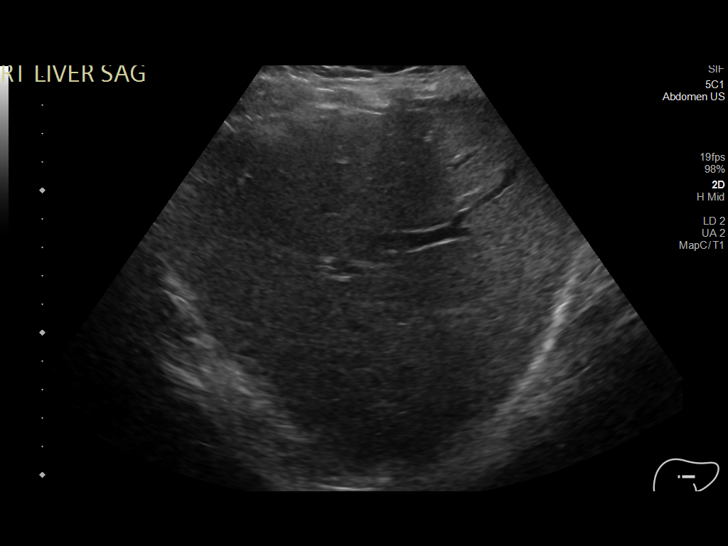
[im 44/44]
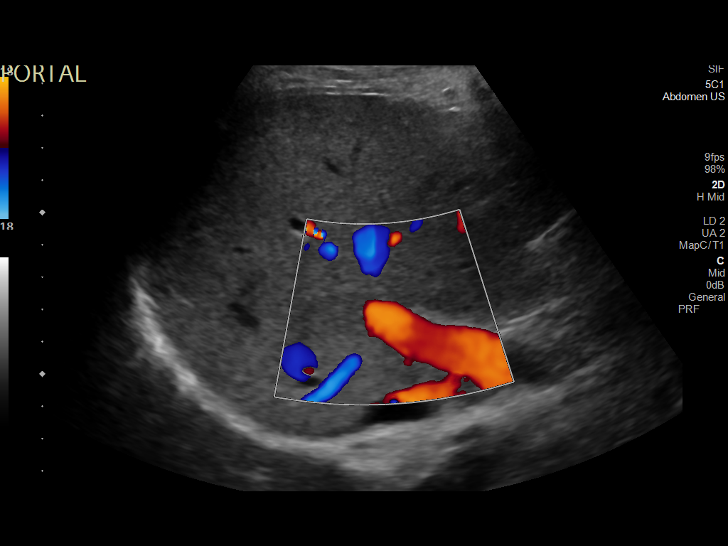

[14 of 25 positions shown; findings below may reference images not displayed]

FINDINGS: Gallbladder:

No gallstones or wall thickening visualized (2.7 mm. No sonographic
Murphy sign noted by sonographer.

Common bile duct:

Diameter: 3.2 mm

Liver:

No focal lesion identified. Within normal limits in parenchymal
echogenicity. Portal vein is patent on color Doppler imaging with
normal direction of blood flow towards the liver.

Other: None.
IMPRESSION: Normal right upper quadrant ultrasound.

## 2024-01-03 ENCOUNTER — Ambulatory Visit: Admitting: Family Medicine

## 2024-01-03 ENCOUNTER — Encounter: Payer: Self-pay | Admitting: Family Medicine

## 2024-01-03 VITALS — BP 106/71 | HR 68 | Ht 65.5 in | Wt 175.0 lb

## 2024-01-03 DIAGNOSIS — Z13220 Encounter for screening for lipoid disorders: Secondary | ICD-10-CM | POA: Diagnosis not present

## 2024-01-03 DIAGNOSIS — Z130 Encounter for screening for diseases of the blood and blood-forming organs and certain disorders involving the immune mechanism: Secondary | ICD-10-CM | POA: Diagnosis not present

## 2024-01-03 DIAGNOSIS — Z0000 Encounter for general adult medical examination without abnormal findings: Secondary | ICD-10-CM

## 2024-01-04 ENCOUNTER — Encounter: Payer: Self-pay | Admitting: Family Medicine

## 2024-01-04 ENCOUNTER — Ambulatory Visit: Payer: Self-pay | Admitting: Family Medicine

## 2024-01-04 LAB — COMPREHENSIVE METABOLIC PANEL WITH GFR
ALT: 17 IU/L (ref 0–32)
AST: 20 IU/L (ref 0–40)
Albumin: 4.8 g/dL (ref 4.0–5.0)
Alkaline Phosphatase: 74 IU/L (ref 44–121)
BUN/Creatinine Ratio: 12 (ref 9–23)
BUN: 8 mg/dL (ref 6–20)
Bilirubin Total: 0.5 mg/dL (ref 0.0–1.2)
CO2: 22 mmol/L (ref 20–29)
Calcium: 9.7 mg/dL (ref 8.7–10.2)
Chloride: 102 mmol/L (ref 96–106)
Creatinine, Ser: 0.67 mg/dL (ref 0.57–1.00)
Globulin, Total: 2.9 g/dL (ref 1.5–4.5)
Glucose: 81 mg/dL (ref 70–99)
Potassium: 4.5 mmol/L (ref 3.5–5.2)
Sodium: 139 mmol/L (ref 134–144)
Total Protein: 7.7 g/dL (ref 6.0–8.5)
eGFR: 125 mL/min/1.73 (ref >59)

## 2024-01-04 LAB — CBC WITH DIFFERENTIAL/PLATELET
Basophils Absolute: 0 x10E3/uL (ref 0.0–0.2)
Basos: 1 %
EOS (ABSOLUTE): 0 x10E3/uL (ref 0.0–0.4)
Eos: 1 %
Hematocrit: 43.2 % (ref 34.0–46.6)
Hemoglobin: 12.9 g/dL (ref 11.1–15.9)
Immature Grans (Abs): 0 x10E3/uL (ref 0.0–0.1)
Immature Granulocytes: 0 %
Lymphocytes Absolute: 2.2 x10E3/uL (ref 0.7–3.1)
Lymphs: 50 %
MCH: 25.1 pg — ABNORMAL LOW (ref 26.6–33.0)
MCHC: 29.9 g/dL — ABNORMAL LOW (ref 31.5–35.7)
MCV: 84 fL (ref 79–97)
Monocytes Absolute: 0.3 x10E3/uL (ref 0.1–0.9)
Monocytes: 6 %
Neutrophils Absolute: 1.8 x10E3/uL (ref 1.4–7.0)
Neutrophils: 42 %
Platelets: 277 x10E3/uL (ref 150–450)
RBC: 5.14 x10E6/uL (ref 3.77–5.28)
RDW: 13.1 % (ref 11.7–15.4)
WBC: 4.4 x10E3/uL (ref 3.4–10.8)

## 2024-01-04 LAB — LIPID PANEL
Chol/HDL Ratio: 2.8 ratio (ref 0.0–4.4)
Cholesterol, Total: 171 mg/dL (ref 100–199)
HDL: 62 mg/dL (ref >39)
LDL Chol Calc (NIH): 94 mg/dL (ref 0–99)
Triglycerides: 82 mg/dL (ref 0–149)
VLDL Cholesterol Cal: 15 mg/dL (ref 5–40)

## 2024-01-04 NOTE — Progress Notes (Signed)
 Established Patient Office Visit  Subjective    Patient ID: Paige Morales, female    DOB: January 02, 2001  Age: 23 y.o. MRN: [id number]  CC:  Chief Complaint  Patient presents with   Annual Exam    HPI Paige Morales presents for routine annual exam. Patient denies acute complaints.   Outpatient Encounter Medications as of 01/03/2024  Medication Sig   Brimonidine Tartrate (LUMIFY OP) Place 1 drop into both eyes daily.   ibuprofen  (ADVIL ) 800 MG tablet Take 1 tablet (800 mg total) by mouth every 8 (eight) hours as needed.   Multiple Vitamin (MULTIVITAMIN PO) Take 2 tablets by mouth daily as needed (When Pt remembers).   OVER THE COUNTER MEDICATION Take 1 tablet by mouth daily as needed (When Pt remembers). Apple Cider Vinegar Gummies   tretinoin  (RETIN-A ) 0.05 % cream Apply a pea size amount to the entire face every other night then increase to nightly as tolerated.   clobetasol  (TEMOVATE ) 0.05 % external solution Apply to affected areas twice daily as directed for 4-6 weeks (Patient not taking: Reported on 01/03/2024)   cycloSPORINE  (RESTASIS ) 0.05 % ophthalmic emulsion Instill 1 drop into both eyes twice a day (Patient not taking: Reported on 01/03/2024)   fluorometholone  (FML) 0.1 % ophthalmic suspension Instill 1 drop into both eyes as directed: 1 drop 4 times a day for 2 weeks then 1 drop 2 times a day for 2 weeks (Patient not taking: Reported on 01/03/2024)   ketorolac  (TORADOL ) 10 MG tablet Take 1 tablet (10 mg) by mouth every 6 hours as needed (pain). (Patient not taking: Reported on 01/03/2024)   metroNIDAZOLE  (FLAGYL ) 500 MG tablet Take 1 tablet (500 mg total) by mouth 2 (two) times daily. (Patient not taking: Reported on 01/03/2024)   neomycin -polymyxin-hydrocortisone (CORTISPORIN ) 3.5-10000-1 OTIC suspension Place 4 drops into the left ear daily. Take for 7 days. (Patient not taking: Reported on 01/03/2024)   polyethylene glycol powder (GLYCOLAX /MIRALAX ) 17 GM/SCOOP powder Take 255 g by mouth  daily for 2 days as directed.  255 grams = 1 bottle (238g) + 1 capful from other bottle. (Patient not taking: Reported on 01/03/2024)   No facility-administered encounter medications on file as of 01/03/2024.    Past Medical History:  Diagnosis Date   Abnormal uterine bleeding    Dysmenorrhea    GERD (gastroesophageal reflux disease)     Past Surgical History:  Procedure Laterality Date   WISDOM TOOTH EXTRACTION  10/22/2022    History reviewed. No pertinent family history.  Social History   Socioeconomic History   Marital status: Significant Other    Spouse name: Not on file   Number of children: 0   Years of education: Not on file   Highest education level: Associate degree: occupational, Scientist, product/process development, or vocational program  Occupational History   Occupation: scheduling  Tobacco Use   Smoking status: Never   Smokeless tobacco: Never  Vaping Use   Vaping status: Never Used  Substance and Sexual Activity   Alcohol use: Yes    Alcohol/week: 1.0 standard drink of alcohol    Types: 1 Glasses of wine per week    Comment: socially--2drinks/mo   Drug use: Never   Sexual activity: Yes    Birth control/protection: None  Other Topics Concern   Not on file  Social History Narrative   Not on file   Social Drivers of Health   Financial Resource Strain: Medium Risk (12/14/2022)   Overall Financial Resource Strain (CARDIA)    Difficulty of  Paying Living Expenses: Somewhat hard  Food Insecurity: No Food Insecurity (01/04/2024)   Hunger Vital Sign    Worried About Running Out of Food in the Last Year: Never true    Ran Out of Food in the Last Year: Never true  Transportation Needs: No Transportation Needs (12/14/2022)   PRAPARE - Administrator, Civil Service (Medical): No    Lack of Transportation (Non-Medical): No  Physical Activity: Unknown (12/14/2022)   Exercise Vital Sign    Days of Exercise per Week: 0 days    Minutes of Exercise per Session: Not on file   Stress: Stress Concern Present (12/14/2022)   [employer] of Occupational Health - Occupational Stress Questionnaire    Feeling of Stress : To some extent  Social Connections: Unknown (12/14/2022)   Social Connection and Isolation Panel    Frequency of Communication with Friends and Family: Three times a week    Frequency of Social Gatherings with Friends and Family: Once a week    Attends Religious Services: Patient declined    Database administrator or Organizations: Patient declined    Attends Banker Meetings: Not on file    Marital Status: Never married  Intimate Partner Violence: Not At Risk (01/04/2024)   Humiliation, Afraid, Rape, and Kick questionnaire    Fear of Current or Ex-Partner: No    Emotionally Abused: No    Physically Abused: No    Sexually Abused: No    Review of Systems  All other systems reviewed and are negative.       Objective    BP 106/71   Pulse 68   Ht 5' 5.5 (1.664 m)   Wt 175 lb (79.4 kg)   LMP 12/31/2023 (Exact Date)   SpO2 98%   BMI 28.68 kg/m   Physical Exam Vitals and nursing note reviewed.  Constitutional:      General: She is not in acute distress. HENT:     Head: Normocephalic and atraumatic.     Right Ear: Tympanic membrane, ear canal and external ear normal.     Left Ear: Tympanic membrane, ear canal and external ear normal.     Nose: Nose normal.     Mouth/Throat:     Mouth: Mucous membranes are moist.     Pharynx: Oropharynx is clear.  Eyes:     Conjunctiva/sclera: Conjunctivae normal.     Pupils: Pupils are equal, round, and reactive to light.  Neck:     Thyroid: No thyromegaly.  Cardiovascular:     Rate and Rhythm: Normal rate and regular rhythm.     Heart sounds: Normal heart sounds. No murmur heard. Pulmonary:     Effort: Pulmonary effort is normal. No respiratory distress.     Breath sounds: Normal breath sounds.  Abdominal:     General: There is no distension.     Palpations: Abdomen is  soft. There is no mass.     Tenderness: There is no abdominal tenderness.  Musculoskeletal:        General: Normal range of motion.     Cervical back: Normal range of motion and neck supple.  Skin:    General: Skin is warm and dry.  Neurological:     General: No focal deficit present.     Mental Status: She is alert and oriented to person, place, and time.  Psychiatric:        Mood and Affect: Mood normal.        Behavior:  Behavior normal.         Assessment & Plan:   Annual physical exam -     Comprehensive metabolic panel with GFR  Screening for deficiency anemia -     CBC with Differential/Platelet  Screening for lipid disorders -     Lipid panel     No follow-ups on file.   Tanda Raguel SQUIBB, MD

## 2024-05-19 ENCOUNTER — Other Ambulatory Visit: Payer: Self-pay | Admitting: Obstetrics and Gynecology

## 2024-05-19 ENCOUNTER — Other Ambulatory Visit (HOSPITAL_COMMUNITY): Payer: Self-pay

## 2024-05-21 NOTE — Telephone Encounter (Signed)
 Med refill request:    ibuprofen  (ADVIL ) 800 MG tablet Start:  06/23/23 Disp: 60  tablets Refills:  0 of 1 remaining  Last AEX:  06/22/23 Last OV:  07/05/23 Next AEX:  06/25/24 Last MMG (if hormonal med):  N/A Refill authorized? Please Advise.

## 2024-05-23 ENCOUNTER — Other Ambulatory Visit (HOSPITAL_COMMUNITY): Payer: Self-pay

## 2024-05-23 MED ORDER — IBUPROFEN 800 MG PO TABS
800.0000 mg | ORAL_TABLET | Freq: Three times a day (TID) | ORAL | 1 refills | Status: AC | PRN
  Filled 2024-05-23: qty 60, 20d supply, fill #0

## 2024-06-25 ENCOUNTER — Ambulatory Visit: Payer: 59 | Admitting: Obstetrics and Gynecology

## 2024-08-27 ENCOUNTER — Ambulatory Visit: Admitting: Obstetrics and Gynecology
